# Patient Record
Sex: Female | Born: 1935 | Race: White | Hispanic: No
Health system: Southern US, Community
[De-identification: ages and names within clinical notes are randomized; demographics above are authoritative.]

## PROBLEM LIST (undated history)

## (undated) DIAGNOSIS — F0390 Unspecified dementia without behavioral disturbance: Secondary | ICD-10-CM

## (undated) DIAGNOSIS — I209 Angina pectoris, unspecified: Secondary | ICD-10-CM

## (undated) DIAGNOSIS — I10 Essential (primary) hypertension: Secondary | ICD-10-CM

## (undated) DIAGNOSIS — R296 Repeated falls: Secondary | ICD-10-CM

## (undated) DIAGNOSIS — E119 Type 2 diabetes mellitus without complications: Secondary | ICD-10-CM

## (undated) DIAGNOSIS — E785 Hyperlipidemia, unspecified: Secondary | ICD-10-CM

## (undated) DIAGNOSIS — M810 Age-related osteoporosis without current pathological fracture: Secondary | ICD-10-CM

---

## 2019-08-16 ENCOUNTER — Emergency Department (HOSPITAL_COMMUNITY): Payer: Medicare PPO

## 2019-08-16 ENCOUNTER — Other Ambulatory Visit: Payer: Self-pay

## 2019-08-16 ENCOUNTER — Encounter (HOSPITAL_COMMUNITY): Payer: Self-pay

## 2019-08-16 ENCOUNTER — Emergency Department (HOSPITAL_COMMUNITY)
Admission: EM | Admit: 2019-08-16 | Discharge: 2019-08-16 | Disposition: A | Discharge status: Home / Self Care | Payer: Medicare PPO | Attending: Emergency Medicine | Admitting: Emergency Medicine

## 2019-08-16 DIAGNOSIS — W01198A Fall on same level from slipping, tripping and stumbling with subsequent striking against other object, initial encounter: Secondary | ICD-10-CM | POA: Diagnosis not present

## 2019-08-16 DIAGNOSIS — S0240DA Maxillary fracture, left side, initial encounter for closed fracture: Secondary | ICD-10-CM | POA: Diagnosis not present

## 2019-08-16 DIAGNOSIS — Z66 Do not resuscitate: Secondary | ICD-10-CM | POA: Diagnosis not present

## 2019-08-16 DIAGNOSIS — Y92009 Unspecified place in unspecified non-institutional (private) residence as the place of occurrence of the external cause: Secondary | ICD-10-CM | POA: Diagnosis not present

## 2019-08-16 DIAGNOSIS — Z23 Encounter for immunization: Secondary | ICD-10-CM | POA: Diagnosis not present

## 2019-08-16 DIAGNOSIS — S0993XA Unspecified injury of face, initial encounter: Secondary | ICD-10-CM | POA: Diagnosis present

## 2019-08-16 DIAGNOSIS — S3991XA Unspecified injury of abdomen, initial encounter: Secondary | ICD-10-CM | POA: Insufficient documentation

## 2019-08-16 DIAGNOSIS — S02842A Fracture of lateral orbital wall, left side, initial encounter for closed fracture: Secondary | ICD-10-CM | POA: Diagnosis not present

## 2019-08-16 DIAGNOSIS — Y939 Activity, unspecified: Secondary | ICD-10-CM | POA: Diagnosis not present

## 2019-08-16 DIAGNOSIS — I10 Essential (primary) hypertension: Secondary | ICD-10-CM | POA: Diagnosis not present

## 2019-08-16 DIAGNOSIS — E119 Type 2 diabetes mellitus without complications: Secondary | ICD-10-CM | POA: Insufficient documentation

## 2019-08-16 DIAGNOSIS — Y999 Unspecified external cause status: Secondary | ICD-10-CM | POA: Diagnosis not present

## 2019-08-16 DIAGNOSIS — S0240CA Maxillary fracture, right side, initial encounter for closed fracture: Secondary | ICD-10-CM | POA: Insufficient documentation

## 2019-08-16 DIAGNOSIS — W19XXXA Unspecified fall, initial encounter: Secondary | ICD-10-CM

## 2019-08-16 DIAGNOSIS — S0292XA Unspecified fracture of facial bones, initial encounter for closed fracture: Secondary | ICD-10-CM

## 2019-08-16 DIAGNOSIS — S299XXA Unspecified injury of thorax, initial encounter: Secondary | ICD-10-CM | POA: Diagnosis not present

## 2019-08-16 HISTORY — DX: Hyperlipidemia, unspecified: E78.5

## 2019-08-16 HISTORY — DX: Angina pectoris, unspecified: I20.9

## 2019-08-16 HISTORY — DX: Type 2 diabetes mellitus without complications: E11.9

## 2019-08-16 HISTORY — DX: Essential (primary) hypertension: I10

## 2019-08-16 HISTORY — DX: Age-related osteoporosis without current pathological fracture: M81.0

## 2019-08-16 LAB — CBC WITH DIFFERENTIAL/PLATELET
Abs Immature Granulocytes: 0.04 K/uL (ref 0.00–0.07)
Basophils Absolute: 0 K/uL (ref 0.0–0.1)
Basophils Relative: 1 %
Eosinophils Absolute: 0.1 K/uL (ref 0.0–0.5)
Eosinophils Relative: 1 %
HCT: 42.5 % (ref 36.0–46.0)
Hemoglobin: 13.6 g/dL (ref 12.0–15.0)
Immature Granulocytes: 1 %
Lymphocytes Relative: 10 %
Lymphs Abs: 0.6 K/uL — ABNORMAL LOW (ref 0.7–4.0)
MCH: 29.6 pg (ref 26.0–34.0)
MCHC: 32 g/dL (ref 30.0–36.0)
MCV: 92.4 fL (ref 80.0–100.0)
Monocytes Absolute: 0.5 K/uL (ref 0.1–1.0)
Monocytes Relative: 9 %
Neutro Abs: 4.7 K/uL (ref 1.7–7.7)
Neutrophils Relative %: 78 %
Platelets: 123 K/uL — ABNORMAL LOW (ref 150–400)
RBC: 4.6 MIL/uL (ref 3.87–5.11)
RDW: 13.1 % (ref 11.5–15.5)
WBC: 5.9 K/uL (ref 4.0–10.5)
nRBC: 0 % (ref 0.0–0.2)

## 2019-08-16 LAB — BASIC METABOLIC PANEL WITH GFR
Anion gap: 12 (ref 5–15)
BUN: 26 mg/dL — ABNORMAL HIGH (ref 8–23)
CO2: 23 mmol/L (ref 22–32)
Calcium: 9.5 mg/dL (ref 8.9–10.3)
Chloride: 103 mmol/L (ref 98–111)
Creatinine, Ser: 1.11 mg/dL — ABNORMAL HIGH (ref 0.44–1.00)
GFR calc Af Amer: 50 mL/min — ABNORMAL LOW
GFR calc non Af Amer: 43 mL/min — ABNORMAL LOW
Glucose, Bld: 234 mg/dL — ABNORMAL HIGH (ref 70–99)
Potassium: 4.4 mmol/L (ref 3.5–5.1)
Sodium: 138 mmol/L (ref 135–145)

## 2019-08-16 LAB — TROPONIN I (HIGH SENSITIVITY)
Troponin I (High Sensitivity): 4 ng/L
Troponin I (High Sensitivity): 6 ng/L (ref <18)

## 2019-08-16 MED ORDER — CEPHALEXIN 500 MG PO CAPS: 500.0000 mg | ORAL_CAPSULE | Freq: Two times a day (BID) | ORAL | 0 refills | Status: DC

## 2019-08-16 MED ORDER — ONDANSETRON HCL 4 MG/2ML IJ SOLN
4.0000 mg | Freq: Once | INTRAMUSCULAR | Status: AC
  Administered 2019-08-16: 4 mg via INTRAVENOUS
  Filled 2019-08-16: qty 2

## 2019-08-16 MED ORDER — LORAZEPAM 2 MG/ML IJ SOLN: 0.5000 mg | Freq: Once | INTRAMUSCULAR | Status: AC

## 2019-08-16 MED ORDER — IOHEXOL 300 MG/ML  SOLN
80.0000 mL | Freq: Once | INTRAMUSCULAR | Status: AC | PRN
  Administered 2019-08-16: 80 mL via INTRAVENOUS

## 2019-08-16 MED ORDER — TETANUS-DIPHTH-ACELL PERTUSSIS 5-2.5-18.5 LF-MCG/0.5 IM SUSP
0.5000 mL | Freq: Once | INTRAMUSCULAR | Status: AC
  Administered 2019-08-16: 0.5 mL via INTRAMUSCULAR
  Filled 2019-08-16: qty 0.5

## 2019-08-16 MED ORDER — LORAZEPAM 2 MG/ML IJ SOLN
INTRAMUSCULAR | Status: AC
  Administered 2019-08-16: 16:00:00 0.5 mg via INTRAMUSCULAR
  Filled 2019-08-16: qty 1

## 2019-08-16 NOTE — ED Triage Notes (Addendum)
Pt resident of [facility].  Staff says pt had been asleep in her chair and got up to walk out of room and fell into the door jam.  Pt has large amount of swelling to left side of face and c/o pain to left shoulder.  Reports nose was bleeding initially but controlled at this time.  Pt states her shoulder and face hurt but will not state her name.  Per ems, pt's cbg 296 and bp 204/96

## 2019-08-16 NOTE — Discharge Instructions (Addendum)
CT scan shows multiple fractures of the left eye socket and left sinus and cheek bone.  Take the antibiotics as prescribed and follow-up with Dr. Ross Marcus in 1 week to assess for healing.  Sleep with the head of bed elevated about 45 degrees and do not blow the nose. Chest x-ray shows a nodule in the lung which needs follow-up in 12 months to make sure it is not increasing. Return to the ED with new or worsening symptoms.

## 2019-08-16 NOTE — ED Provider Notes (Signed)
[facility] EMERGENCY DEPARTMENT Provider Note   CSN: [id number] Arrival date & time: 08/16/19  1434     History Chief Complaint  Patient presents with  . Fall    Christina Ball is a 86 y.o. female.  Level 5 caveat.  Patient unable to give a history.  Per EMS she was walking in her room and fell onto her door jam.  There is no loss of consciousness.  She sustained injury to her face as well as left shoulder.  There is a large amount of swelling to the left side of her face with some bleeding from her nose.  Patient complaining of pain in her face of her shoulder but unable to answer orientation questions.  She was found to be hypertensive per EMS.  No blood thinners on her medication list.  Complains of pain to her left face and left shoulder.  Discussed with patient's niece Christina Ball who is her power of attorney.  [phone].  She states patient does have dementia and normally cannot give a history or tell what is going on.  It is not unusual for her not to know the situation.  She does have a DNR in place.  She does not take any blood thinners. Christina Ball would not want her to be on life support or have resuscitation.  The history is provided by the patient and the EMS personnel. The history is limited by the condition of the patient.  Fall       Past Medical History:  Diagnosis Date  . Angina pectoris (HCC)   . Diabetes mellitus without complication (HCC)   . Hyperlipidemia   . Hypertension   . Osteoporosis     There are no problems to display for this patient.   History reviewed. No pertinent surgical history.   OB History   No obstetric history on file.     No family history on file.  Social History   Tobacco Use  . Smoking status: Unknown If Ever Smoked  Substance Use Topics  . Alcohol use: Not Currently    Comment: unknown  . Drug use: Not Currently    Comment: unknown    Home Medications Prior to Admission medications   Not on File    Allergies      Patient has no allergy information on record.  Review of Systems   Review of Systems  Unable to perform ROS: Patient nonverbal  Constitutional: Negative for activity change.    Physical Exam Updated Vital Signs BP (!) 177/100 (BP Location: Right Arm)   Pulse 89   Temp 98.3 F (36.8 C) (Oral)   Resp 20   SpO2 97%   Physical Exam Vitals and nursing note reviewed.  Constitutional:      General: She is not in acute distress.    Appearance: She is well-developed.  HENT:     Head: Normocephalic and atraumatic.     Comments: Large amount of swelling to left face and zygoma.  Periorbital ecchymosis on the left.  Patient unable to follow directions to test extraocular movements but is moving eyes across midline    Ears:     Comments: No septal hematoma or hemotympanum    Nose:     Comments: Dried blood in nares bilaterally, no septal hematoma    Mouth/Throat:     Pharynx: No oropharyngeal exudate.     Comments: Large amount of swelling to left cheek, dried blood in mouth, no active bleeding Eyes:     Conjunctiva/sclera:  Conjunctivae normal.     Pupils: Pupils are equal, round, and reactive to light.  Neck:     Comments: No C-spine tenderness Cardiovascular:     Rate and Rhythm: Normal rate and regular rhythm.     Heart sounds: Normal heart sounds. No murmur.  Pulmonary:     Effort: Pulmonary effort is normal. No respiratory distress.     Breath sounds: Normal breath sounds.  Abdominal:     Palpations: Abdomen is soft.     Tenderness: There is no abdominal tenderness. There is no guarding or rebound.  Musculoskeletal:        General: No tenderness. Normal range of motion.     Cervical back: Normal range of motion and neck supple.     Comments: No T or L-spine tenderness, full range of motion of hips bilaterally  Tenderness to palpation of left shoulder without significant deformity  Skin:    General: Skin is warm.  Neurological:     Mental Status: She is alert.      Motor: No abnormal muscle tone.     Comments: Moves all extremities, intermittently follows commands.  Mostly nonverbal. Unable to give a history.  Psychiatric:        Behavior: Behavior normal.     ED Results / Procedures / Treatments   Labs (all labs ordered are listed, but only abnormal results are displayed) Labs Reviewed  CBC WITH DIFFERENTIAL/PLATELET - Abnormal; Notable for the following components:      Result Value   Platelets 123 (*)    Lymphs Abs 0.6 (*)    All other components within normal limits  BASIC METABOLIC PANEL - Abnormal; Notable for the following components:   Glucose, Bld 234 (*)    BUN 26 (*)    Creatinine, Ser 1.11 (*)    GFR calc non Af Amer 43 (*)    GFR calc Af Amer 50 (*)    All other components within normal limits  TROPONIN I (HIGH SENSITIVITY)  TROPONIN I (HIGH SENSITIVITY)    EKG EKG Interpretation  Date/Time:  Sunday August 16 2019 14:40:01 EST Ventricular Rate:  83 PR Interval:    QRS Duration: 91 QT Interval:  370 QTC Calculation: 435 R Axis:   -7 Text Interpretation: Sinus rhythm Multiple premature complexes, vent & supraven Aberrant conduction of SV complex(es) Inferior infarct, old No previous ECGs available Confirmed by Ezequiel Essex [phone]) on 08/16/2019 3:51:22 PM   Radiology DG Chest 1 View  Result Date: 08/16/2019 CLINICAL DATA:  Pain EXAM: CHEST  1 VIEW COMPARISON:  None. FINDINGS: There is an airspace opacity overlying the left mid lung zone. The lung volumes are low. Kerley B lines are noted. The heart size is normal. There is a small left-sided pleural effusion. There is no pneumothorax. There are multiple age-indeterminate left-sided rib fractures, several which are suspicious for acute fractures. Aortic calcifications are noted. IMPRESSION: 1. Possible acute left-sided rib fractures. Correlation with physical exam is recommended. A dedicated left rib series may be useful for further evaluation. 2. Small left-sided  pleural effusion.  No pneumothorax. 3. Airspace opacity in the left mid lung zone of unknown clinical significance. This could represent an infiltrate or pulmonary contusion. A follow-up chest x-ray is recommended in 4-6 weeks to confirm resolution of this finding. 4. Low lung volumes.  Probable mild interstitial edema. Electronically Signed   By: Constance Holster M.D.   On: 08/16/2019 16:20   DG Pelvis 1-2 Views  Result Date: 08/16/2019 CLINICAL  DATA:  Pain status post fall EXAM: PELVIS - 1-2 VIEW COMPARISON:  None. FINDINGS: There is no evidence of pelvic fracture or diastasis. No pelvic bone lesions are seen. IMPRESSION: Negative. Electronically Signed   By: Katherine Mantle M.D.   On: 08/16/2019 16:21   CT Head Wo Contrast  Result Date: 08/16/2019 CLINICAL DATA:  Fall from standing height into door frame. EXAM: CT HEAD WITHOUT CONTRAST CT MAXILLOFACIAL WITHOUT CONTRAST CT CERVICAL SPINE WITHOUT CONTRAST TECHNIQUE: Multidetector CT imaging of the head, cervical spine, and maxillofacial structures were performed using the standard protocol without intravenous contrast. Multiplanar CT image reconstructions of the cervical spine and maxillofacial structures were also generated. COMPARISON:  None. FINDINGS: Moderate motion artifact throughout the exam as multiple images had to be repeated. CT HEAD FINDINGS Brain: Ventricles, cisterns and CSF spaces are mildly prominent compatible with age related atrophy. There is chronic ischemic microvascular disease. There is no mass, mass effect, shift of midline structures or acute hemorrhage. Evidence of acute infarction Vascular: No hyperdense vessel or unexpected calcification. Skull: No evidence of skull fracture. Displaced fractures of the anterolateral walls of the left maxillary sinus and left orbital floor. Other: Left periorbital soft tissue swelling. CT MAXILLOFACIAL FINDINGS Osseous: Examination demonstrates displaced comminuted fractures of the anterior  wall left maxillary sinus/left orbital floor. No entrapment of left intraorbital contents. There is minimally displaced comminuted fracture of the lateral wall of the left maxillary sinus. Subtle nondisplaced fracture involving the anterior wall of the right maxillary sinus. Subtle fracture along the posterior aspect of the lateral wall of the left orbit. Moderate degenerative changes of the temporomandibular joints bilaterally. Orbits: Globes are normal and symmetric. Retrobulbar spaces are normal. Moderate left periorbital soft tissue swelling. Depressed slightly comminuted fracture of the left orbital floor as described above. No entrapment of orbital contents. Subtle fracture of the posterior aspect of the lateral wall of the left orbit. Sinuses: Moderate opacification throughout the maxillary sinuses likely hemorrhagic debris. Mastoid air cells are clear. Deviation of the nasal septum to the right. Left maxillary sinus wall fractures as described. Soft tissues: Moderate soft tissue swelling over the left mid to lower face and periorbital region. CT CERVICAL SPINE FINDINGS Alignment: No posttraumatic subluxation. Skull base and vertebrae: Atlantoaxial articulation is unremarkable. There is uncovertebral joint spurring and facet arthropathy. No definite acute fracture. Mild to moderate spondylosis of the cervical spine. Vertebral body heights are maintained. Soft tissues and spinal canal: No prevertebral fluid or swelling. No visible canal hematoma. Disc levels: Mild disc space narrowing is present at the C6-7 level. Upper chest: No acute findings. Other: None. IMPRESSION: 1.  No acute brain injury. 2. Chronic ischemic microvascular disease and age related atrophic change. 3. Multiple acute facial bone fractures as described above involving the lateral wall and inferior floor of the left orbit, anterior and lateral walls of the left maxillary sinus and anterior wall of the right maxillary sinus. Associated soft  tissue swelling over the left face and periorbital region. Hemorrhagic debris within the maxillary sinuses. 4.  No acute cervical spine injury. 5. Mild to moderate spondylosis of the cervical spine with disc disease at the C6-7 level. Electronically Signed   By: Elberta Fortis M.D.   On: 08/16/2019 17:11   CT Chest W Contrast  Result Date: 08/16/2019 CLINICAL DATA:  Fall. Chest and abdominal trauma and pain. Initial encounter. EXAM: CT CHEST, ABDOMEN, AND PELVIS WITH CONTRAST TECHNIQUE: Multidetector CT imaging of the chest, abdomen and pelvis was performed following the  standard protocol during bolus administration of intravenous contrast. CONTRAST:  80mL OMNIPAQUE IOHEXOL 300 MG/ML  SOLN COMPARISON:  None. FINDINGS: CT CHEST FINDINGS Cardiovascular: No evidence of thoracic aortic injury or mediastinal hematoma. No pericardial effusion. Aortic and coronary artery atherosclerosis incidentally noted. Mediastinum/Nodes: No evidence of pneumomediastinum. No masses or pathologically enlarged lymph nodes identified. Lungs/Pleura: No evidence of pulmonary contusion. Mild atelectasis seen in the dependent portions of the lower lobes. No evidence of pneumothorax or hemothorax. A 4 mm pulmonary nodule is seen in the posterior left upper lobe on image 28/series 4. Musculoskeletal: No acute fractures or suspicious bone lesions identified. CT ABDOMEN PELVIS FINDINGS Hepatobiliary: No hepatic laceration or mass identified. Mild-to-moderate diffuse hepatic steatosis is seen. Gallstones are seen, however there is no evidence of cholecystitis or biliary dilatation. Pancreas: No parenchymal laceration, mass, or inflammatory changes identified. Spleen: No evidence of splenic laceration. Adrenal/Urinary Tract: No hemorrhage or parenchymal lacerations identified. Tiny cyst noted in upper pole of right kidney. No evidence of mass or hydronephrosis. Unremarkable unopacified urinary bladder. Stomach/Bowel: Unopacified bowel loops are  unremarkable in appearance. No evidence of hemoperitoneum. Diverticulosis is seen mainly involving the sigmoid colon, however there is no evidence of diverticulitis. Vascular/Lymphatic: No evidence of abdominal aortic injury or retroperitoneal hemorrhage. No pathologically enlarged lymph nodes identified. Aortic atherosclerosis incidentally noted. Reproductive:  No mass or other significant abnormality identified. Other:  None. Musculoskeletal: No acute fractures or suspicious bone lesions identified. IMPRESSION: 1. No evidence of traumatic injury or other acute findings within the chest, abdomen, or pelvis. 2. 4 mm indeterminate left upper lobe pulmonary nodule. No follow-up needed if patient is low-risk. Non-contrast chest CT can be considered in 12 months if patient is high-risk. This recommendation follows the consensus statement: Guidelines for Management of Incidental Pulmonary Nodules Detected on CT Images: From the Fleischner Society 2017; Radiology 2017; 284:228-243. 3. Hepatic steatosis and cholelithiasis. No radiographic evidence of cholecystitis. 4. Colonic diverticulosis, without radiographic evidence of diverticulitis. Electronically Signed   By: Danae OrleansJohn A Stahl M.D.   On: 08/16/2019 18:15   CT Cervical Spine Wo Contrast  Result Date: 08/16/2019 CLINICAL DATA:  Fall from standing height into door frame. EXAM: CT HEAD WITHOUT CONTRAST CT MAXILLOFACIAL WITHOUT CONTRAST CT CERVICAL SPINE WITHOUT CONTRAST TECHNIQUE: Multidetector CT imaging of the head, cervical spine, and maxillofacial structures were performed using the standard protocol without intravenous contrast. Multiplanar CT image reconstructions of the cervical spine and maxillofacial structures were also generated. COMPARISON:  None. FINDINGS: Moderate motion artifact throughout the exam as multiple images had to be repeated. CT HEAD FINDINGS Brain: Ventricles, cisterns and CSF spaces are mildly prominent compatible with age related atrophy.  There is chronic ischemic microvascular disease. There is no mass, mass effect, shift of midline structures or acute hemorrhage. Evidence of acute infarction Vascular: No hyperdense vessel or unexpected calcification. Skull: No evidence of skull fracture. Displaced fractures of the anterolateral walls of the left maxillary sinus and left orbital floor. Other: Left periorbital soft tissue swelling. CT MAXILLOFACIAL FINDINGS Osseous: Examination demonstrates displaced comminuted fractures of the anterior wall left maxillary sinus/left orbital floor. No entrapment of left intraorbital contents. There is minimally displaced comminuted fracture of the lateral wall of the left maxillary sinus. Subtle nondisplaced fracture involving the anterior wall of the right maxillary sinus. Subtle fracture along the posterior aspect of the lateral wall of the left orbit. Moderate degenerative changes of the temporomandibular joints bilaterally. Orbits: Globes are normal and symmetric. Retrobulbar spaces are normal. Moderate left periorbital soft tissue  swelling. Depressed slightly comminuted fracture of the left orbital floor as described above. No entrapment of orbital contents. Subtle fracture of the posterior aspect of the lateral wall of the left orbit. Sinuses: Moderate opacification throughout the maxillary sinuses likely hemorrhagic debris. Mastoid air cells are clear. Deviation of the nasal septum to the right. Left maxillary sinus wall fractures as described. Soft tissues: Moderate soft tissue swelling over the left mid to lower face and periorbital region. CT CERVICAL SPINE FINDINGS Alignment: No posttraumatic subluxation. Skull base and vertebrae: Atlantoaxial articulation is unremarkable. There is uncovertebral joint spurring and facet arthropathy. No definite acute fracture. Mild to moderate spondylosis of the cervical spine. Vertebral body heights are maintained. Soft tissues and spinal canal: No prevertebral fluid or  swelling. No visible canal hematoma. Disc levels: Mild disc space narrowing is present at the C6-7 level. Upper chest: No acute findings. Other: None. IMPRESSION: 1.  No acute brain injury. 2. Chronic ischemic microvascular disease and age related atrophic change. 3. Multiple acute facial bone fractures as described above involving the lateral wall and inferior floor of the left orbit, anterior and lateral walls of the left maxillary sinus and anterior wall of the right maxillary sinus. Associated soft tissue swelling over the left face and periorbital region. Hemorrhagic debris within the maxillary sinuses. 4.  No acute cervical spine injury. 5. Mild to moderate spondylosis of the cervical spine with disc disease at the C6-7 level. Electronically Signed   By: Elberta Fortisaniel  Boyle M.D.   On: 08/16/2019 17:11   CT ABDOMEN PELVIS W CONTRAST  Result Date: 08/16/2019 CLINICAL DATA:  Fall. Chest and abdominal trauma and pain. Initial encounter. EXAM: CT CHEST, ABDOMEN, AND PELVIS WITH CONTRAST TECHNIQUE: Multidetector CT imaging of the chest, abdomen and pelvis was performed following the standard protocol during bolus administration of intravenous contrast. CONTRAST:  80mL OMNIPAQUE IOHEXOL 300 MG/ML  SOLN COMPARISON:  None. FINDINGS: CT CHEST FINDINGS Cardiovascular: No evidence of thoracic aortic injury or mediastinal hematoma. No pericardial effusion. Aortic and coronary artery atherosclerosis incidentally noted. Mediastinum/Nodes: No evidence of pneumomediastinum. No masses or pathologically enlarged lymph nodes identified. Lungs/Pleura: No evidence of pulmonary contusion. Mild atelectasis seen in the dependent portions of the lower lobes. No evidence of pneumothorax or hemothorax. A 4 mm pulmonary nodule is seen in the posterior left upper lobe on image 28/series 4. Musculoskeletal: No acute fractures or suspicious bone lesions identified. CT ABDOMEN PELVIS FINDINGS Hepatobiliary: No hepatic laceration or mass  identified. Mild-to-moderate diffuse hepatic steatosis is seen. Gallstones are seen, however there is no evidence of cholecystitis or biliary dilatation. Pancreas: No parenchymal laceration, mass, or inflammatory changes identified. Spleen: No evidence of splenic laceration. Adrenal/Urinary Tract: No hemorrhage or parenchymal lacerations identified. Tiny cyst noted in upper pole of right kidney. No evidence of mass or hydronephrosis. Unremarkable unopacified urinary bladder. Stomach/Bowel: Unopacified bowel loops are unremarkable in appearance. No evidence of hemoperitoneum. Diverticulosis is seen mainly involving the sigmoid colon, however there is no evidence of diverticulitis. Vascular/Lymphatic: No evidence of abdominal aortic injury or retroperitoneal hemorrhage. No pathologically enlarged lymph nodes identified. Aortic atherosclerosis incidentally noted. Reproductive:  No mass or other significant abnormality identified. Other:  None. Musculoskeletal: No acute fractures or suspicious bone lesions identified. IMPRESSION: 1. No evidence of traumatic injury or other acute findings within the chest, abdomen, or pelvis. 2. 4 mm indeterminate left upper lobe pulmonary nodule. No follow-up needed if patient is low-risk. Non-contrast chest CT can be considered in 12 months if patient is high-risk. This  recommendation follows the consensus statement: Guidelines for Management of Incidental Pulmonary Nodules Detected on CT Images: From the Fleischner Society 2017; Radiology 2017; 284:228-243. 3. Hepatic steatosis and cholelithiasis. No radiographic evidence of cholecystitis. 4. Colonic diverticulosis, without radiographic evidence of diverticulitis. Electronically Signed   By: Danae Orleans M.D.   On: 08/16/2019 18:15   DG Shoulder Left  Result Date: 08/16/2019 CLINICAL DATA:  Pain EXAM: LEFT SHOULDER - 2+ VIEW COMPARISON:  None. FINDINGS: There is no acute displaced fracture. No dislocation. Moderate degenerative  changes are noted of the left glenohumeral joint. There are old healed left-sided rib fractures. IMPRESSION: Negative. Electronically Signed   By: Katherine Mantle M.D.   On: 08/16/2019 16:17   DG Humerus Left  Result Date: 08/16/2019 CLINICAL DATA:  Pain status post fall EXAM: LEFT HUMERUS - 2+ VIEW COMPARISON:  None. FINDINGS: There is no evidence of fracture or other focal bone lesions. Soft tissues are unremarkable. IMPRESSION: Negative. Electronically Signed   By: Katherine Mantle M.D.   On: 08/16/2019 16:22   CT Maxillofacial Wo Contrast  Result Date: 08/16/2019 CLINICAL DATA:  Fall from standing height into door frame. EXAM: CT HEAD WITHOUT CONTRAST CT MAXILLOFACIAL WITHOUT CONTRAST CT CERVICAL SPINE WITHOUT CONTRAST TECHNIQUE: Multidetector CT imaging of the head, cervical spine, and maxillofacial structures were performed using the standard protocol without intravenous contrast. Multiplanar CT image reconstructions of the cervical spine and maxillofacial structures were also generated. COMPARISON:  None. FINDINGS: Moderate motion artifact throughout the exam as multiple images had to be repeated. CT HEAD FINDINGS Brain: Ventricles, cisterns and CSF spaces are mildly prominent compatible with age related atrophy. There is chronic ischemic microvascular disease. There is no mass, mass effect, shift of midline structures or acute hemorrhage. Evidence of acute infarction Vascular: No hyperdense vessel or unexpected calcification. Skull: No evidence of skull fracture. Displaced fractures of the anterolateral walls of the left maxillary sinus and left orbital floor. Other: Left periorbital soft tissue swelling. CT MAXILLOFACIAL FINDINGS Osseous: Examination demonstrates displaced comminuted fractures of the anterior wall left maxillary sinus/left orbital floor. No entrapment of left intraorbital contents. There is minimally displaced comminuted fracture of the lateral wall of the left maxillary sinus.  Subtle nondisplaced fracture involving the anterior wall of the right maxillary sinus. Subtle fracture along the posterior aspect of the lateral wall of the left orbit. Moderate degenerative changes of the temporomandibular joints bilaterally. Orbits: Globes are normal and symmetric. Retrobulbar spaces are normal. Moderate left periorbital soft tissue swelling. Depressed slightly comminuted fracture of the left orbital floor as described above. No entrapment of orbital contents. Subtle fracture of the posterior aspect of the lateral wall of the left orbit. Sinuses: Moderate opacification throughout the maxillary sinuses likely hemorrhagic debris. Mastoid air cells are clear. Deviation of the nasal septum to the right. Left maxillary sinus wall fractures as described. Soft tissues: Moderate soft tissue swelling over the left mid to lower face and periorbital region. CT CERVICAL SPINE FINDINGS Alignment: No posttraumatic subluxation. Skull base and vertebrae: Atlantoaxial articulation is unremarkable. There is uncovertebral joint spurring and facet arthropathy. No definite acute fracture. Mild to moderate spondylosis of the cervical spine. Vertebral body heights are maintained. Soft tissues and spinal canal: No prevertebral fluid or swelling. No visible canal hematoma. Disc levels: Mild disc space narrowing is present at the C6-7 level. Upper chest: No acute findings. Other: None. IMPRESSION: 1.  No acute brain injury. 2. Chronic ischemic microvascular disease and age related atrophic change. 3. Multiple acute facial  bone fractures as described above involving the lateral wall and inferior floor of the left orbit, anterior and lateral walls of the left maxillary sinus and anterior wall of the right maxillary sinus. Associated soft tissue swelling over the left face and periorbital region. Hemorrhagic debris within the maxillary sinuses. 4.  No acute cervical spine injury. 5. Mild to moderate spondylosis of the  cervical spine with disc disease at the C6-7 level. Electronically Signed   By: Elberta Fortis M.D.   On: 08/16/2019 17:11    Procedures Procedures (including critical care time)  Medications Ordered in ED Medications  Tdap (BOOSTRIX) injection 0.5 mL (has no administration in time range)    ED Course  I have reviewed the triage vital signs and the nursing notes.  Pertinent labs & imaging results that were available during my care of the patient were reviewed by me and considered in my medical decision making (see chart for details).    MDM Rules/Calculators/A&P                     Patient with fall with facial injury as well as left shoulder pain.  Unable to give a history  X-ray is concerning for multiple left-sided rib fractures that appear to be subacute.  CT head is negative.  CT face shows fractures of the left orbit and left maxillary sinus as well as the right maxillary sinus.  Patient unable to cooperate and follow directions to test retro-ocular movements.  Facial fractures discussed with Dr. Ross Marcus of oral trauma.  Dr. Ross Marcus reviewed CT images.  He feels there is a low likelihood that she is clinically entrapped.  He states her fracture is minimally displaced.  He recommends sinus precautions, antibiotics and to be follow-up in his office. This will likely be nonoperative.  CT of chest shows no traumatic injury to chest, abdomen or pelvis.  There is a 1 nodule that will need follow-up in 1 year. Rib fractures not present.   Results d/w Christina Ball her POA and niece. Patient able to ambulate and tolerate PO.  followup with ENT next week. Ice. NSAIDs.  Return precautions discussed.  She appears stable for discharge back to her facility.  Final Clinical Impression(s) / ED Diagnoses Final diagnoses:  Fall, initial encounter  Closed extensive facial fractures, initial encounter [facility])    Rx / DC Orders ED Discharge Orders    None       Chandlar Staebell, Jeannett Senior,  MD 08/17/19 0107

## 2019-08-24 ENCOUNTER — Emergency Department (HOSPITAL_COMMUNITY): Payer: Medicare PPO

## 2019-08-24 ENCOUNTER — Other Ambulatory Visit: Payer: Self-pay

## 2019-08-24 ENCOUNTER — Encounter (HOSPITAL_COMMUNITY): Payer: Self-pay | Admitting: Emergency Medicine

## 2019-08-24 ENCOUNTER — Inpatient Hospital Stay (HOSPITAL_COMMUNITY)
Admission: EM | Admit: 2019-08-24 | Discharge: 2019-08-27 | DRG: 682 | Disposition: A | Discharge status: Home Health | Payer: Medicare PPO | Attending: Internal Medicine | Admitting: Internal Medicine

## 2019-08-24 DIAGNOSIS — W19XXXA Unspecified fall, initial encounter: Secondary | ICD-10-CM | POA: Diagnosis present

## 2019-08-24 DIAGNOSIS — R4182 Altered mental status, unspecified: Secondary | ICD-10-CM | POA: Diagnosis not present

## 2019-08-24 DIAGNOSIS — F05 Delirium due to known physiological condition: Secondary | ICD-10-CM | POA: Diagnosis present

## 2019-08-24 DIAGNOSIS — E119 Type 2 diabetes mellitus without complications: Secondary | ICD-10-CM | POA: Diagnosis not present

## 2019-08-24 DIAGNOSIS — Z79899 Other long term (current) drug therapy: Secondary | ICD-10-CM

## 2019-08-24 DIAGNOSIS — F0390 Unspecified dementia without behavioral disturbance: Secondary | ICD-10-CM | POA: Diagnosis not present

## 2019-08-24 DIAGNOSIS — E785 Hyperlipidemia, unspecified: Secondary | ICD-10-CM | POA: Diagnosis present

## 2019-08-24 DIAGNOSIS — E1122 Type 2 diabetes mellitus with diabetic chronic kidney disease: Secondary | ICD-10-CM | POA: Diagnosis present

## 2019-08-24 DIAGNOSIS — G9340 Encephalopathy, unspecified: Secondary | ICD-10-CM | POA: Diagnosis present

## 2019-08-24 DIAGNOSIS — N179 Acute kidney failure, unspecified: Secondary | ICD-10-CM | POA: Diagnosis not present

## 2019-08-24 DIAGNOSIS — R339 Retention of urine, unspecified: Secondary | ICD-10-CM | POA: Diagnosis present

## 2019-08-24 DIAGNOSIS — S0083XA Contusion of other part of head, initial encounter: Secondary | ICD-10-CM | POA: Diagnosis present

## 2019-08-24 DIAGNOSIS — G9341 Metabolic encephalopathy: Secondary | ICD-10-CM | POA: Diagnosis present

## 2019-08-24 DIAGNOSIS — N1832 Chronic kidney disease, stage 3b: Secondary | ICD-10-CM | POA: Diagnosis present

## 2019-08-24 DIAGNOSIS — Z66 Do not resuscitate: Secondary | ICD-10-CM | POA: Diagnosis present

## 2019-08-24 DIAGNOSIS — E860 Dehydration: Secondary | ICD-10-CM | POA: Diagnosis present

## 2019-08-24 DIAGNOSIS — Z7984 Long term (current) use of oral hypoglycemic drugs: Secondary | ICD-10-CM

## 2019-08-24 DIAGNOSIS — R338 Other retention of urine: Secondary | ICD-10-CM | POA: Diagnosis present

## 2019-08-24 DIAGNOSIS — Z20822 Contact with and (suspected) exposure to covid-19: Secondary | ICD-10-CM | POA: Diagnosis present

## 2019-08-24 DIAGNOSIS — I10 Essential (primary) hypertension: Secondary | ICD-10-CM | POA: Diagnosis present

## 2019-08-24 DIAGNOSIS — Y92129 Unspecified place in nursing home as the place of occurrence of the external cause: Secondary | ICD-10-CM

## 2019-08-24 DIAGNOSIS — M810 Age-related osteoporosis without current pathological fracture: Secondary | ICD-10-CM | POA: Diagnosis present

## 2019-08-24 DIAGNOSIS — I129 Hypertensive chronic kidney disease with stage 1 through stage 4 chronic kidney disease, or unspecified chronic kidney disease: Secondary | ICD-10-CM | POA: Diagnosis present

## 2019-08-24 LAB — CBC WITH DIFFERENTIAL/PLATELET
Abs Immature Granulocytes: 0.03 10*3/uL (ref 0.00–0.07)
Basophils Absolute: 0 10*3/uL (ref 0.0–0.1)
Basophils Relative: 0 %
Eosinophils Absolute: 0 10*3/uL (ref 0.0–0.5)
Eosinophils Relative: 0 %
HCT: 42.8 % (ref 36.0–46.0)
Hemoglobin: 13.6 g/dL (ref 12.0–15.0)
Immature Granulocytes: 0 %
Lymphocytes Relative: 3 %
Lymphs Abs: 0.2 10*3/uL — ABNORMAL LOW (ref 0.7–4.0)
MCH: 29.1 pg (ref 26.0–34.0)
MCHC: 31.8 g/dL (ref 30.0–36.0)
MCV: 91.6 fL (ref 80.0–100.0)
Monocytes Absolute: 0.2 10*3/uL (ref 0.1–1.0)
Monocytes Relative: 3 %
Neutro Abs: 6.5 10*3/uL (ref 1.7–7.7)
Neutrophils Relative %: 94 %
Platelets: 186 10*3/uL (ref 150–400)
RBC: 4.67 MIL/uL (ref 3.87–5.11)
RDW: 13.1 % (ref 11.5–15.5)
WBC: 7 10*3/uL (ref 4.0–10.5)
nRBC: 0 % (ref 0.0–0.2)

## 2019-08-24 LAB — COMPREHENSIVE METABOLIC PANEL
ALT: 42 U/L (ref 0–44)
AST: 46 U/L — ABNORMAL HIGH (ref 15–41)
Albumin: 4.3 g/dL (ref 3.5–5.0)
Alkaline Phosphatase: 62 U/L (ref 38–126)
Anion gap: 16 — ABNORMAL HIGH (ref 5–15)
BUN: 56 mg/dL — ABNORMAL HIGH (ref 8–23)
CO2: 18 mmol/L — ABNORMAL LOW (ref 22–32)
Calcium: 9.6 mg/dL (ref 8.9–10.3)
Chloride: 103 mmol/L (ref 98–111)
Creatinine, Ser: 1.48 mg/dL — ABNORMAL HIGH (ref 0.44–1.00)
GFR calc Af Amer: 35 mL/min — ABNORMAL LOW (ref >60)
GFR calc non Af Amer: 30 mL/min — ABNORMAL LOW (ref >60)
Glucose, Bld: 336 mg/dL — ABNORMAL HIGH (ref 70–99)
Potassium: 4.6 mmol/L (ref 3.5–5.1)
Sodium: 137 mmol/L (ref 135–145)
Total Bilirubin: 0.9 mg/dL (ref 0.3–1.2)
Total Protein: 7.8 g/dL (ref 6.5–8.1)

## 2019-08-24 LAB — URINALYSIS, ROUTINE W REFLEX MICROSCOPIC
Bilirubin Urine: NEGATIVE
Glucose, UA: 150 mg/dL — AB
Hgb urine dipstick: NEGATIVE
Ketones, ur: 5 mg/dL — AB
Leukocytes,Ua: NEGATIVE
Nitrite: NEGATIVE
Protein, ur: NEGATIVE mg/dL
Specific Gravity, Urine: 1.019 (ref 1.005–1.030)
pH: 5 (ref 5.0–8.0)

## 2019-08-24 LAB — BLOOD GAS, VENOUS
Acid-base deficit: 5.7 mmol/L — ABNORMAL HIGH (ref 0.0–2.0)
Bicarbonate: 19.7 mmol/L — ABNORMAL LOW (ref 20.0–28.0)
FIO2: 21
O2 Saturation: 82.5 %
Patient temperature: 36.3
pCO2, Ven: 34.3 mmHg — ABNORMAL LOW (ref 44.0–60.0)
pH, Ven: 7.358 (ref 7.250–7.430)
pO2, Ven: 50.8 mmHg — ABNORMAL HIGH (ref 32.0–45.0)

## 2019-08-24 LAB — RAPID URINE DRUG SCREEN, HOSP PERFORMED
Amphetamines: NOT DETECTED
Barbiturates: NOT DETECTED
Benzodiazepines: NOT DETECTED
Cocaine: NOT DETECTED
Opiates: NOT DETECTED
Tetrahydrocannabinol: NOT DETECTED

## 2019-08-24 LAB — CBG MONITORING, ED: Glucose-Capillary: 288 mg/dL — ABNORMAL HIGH (ref 70–99)

## 2019-08-24 LAB — AMMONIA: Ammonia: 17 umol/L (ref 9–35)

## 2019-08-24 LAB — RESPIRATORY PANEL BY RT PCR (FLU A&B, COVID)
Influenza A by PCR: NEGATIVE
Influenza B by PCR: NEGATIVE
SARS Coronavirus 2 by RT PCR: NEGATIVE

## 2019-08-24 LAB — LIPASE, BLOOD: Lipase: 33 U/L (ref 11–51)

## 2019-08-24 LAB — TSH: TSH: 1.245 u[IU]/mL (ref 0.350–4.500)

## 2019-08-24 MED ORDER — SODIUM CHLORIDE 0.45 % IV SOLN: INTRAVENOUS | Status: DC

## 2019-08-24 MED ORDER — ONDANSETRON HCL 4 MG PO TABS: 4.0000 mg | ORAL_TABLET | Freq: Four times a day (QID) | ORAL | Status: DC | PRN

## 2019-08-24 MED ORDER — ONDANSETRON HCL 4 MG/2ML IJ SOLN: 4.0000 mg | Freq: Four times a day (QID) | INTRAMUSCULAR | Status: DC | PRN

## 2019-08-24 MED ORDER — HEPARIN SODIUM (PORCINE) 5000 UNIT/ML IJ SOLN
5000.0000 [IU] | Freq: Three times a day (TID) | INTRAMUSCULAR | Status: DC
  Administered 2019-08-24 – 2019-08-27 (×9): 5000 [IU] via SUBCUTANEOUS
  Filled 2019-08-24 (×8): qty 1

## 2019-08-24 MED ORDER — ACETAMINOPHEN 325 MG PO TABS: 650.0000 mg | ORAL_TABLET | Freq: Four times a day (QID) | ORAL | Status: DC | PRN

## 2019-08-24 MED ORDER — ACETAMINOPHEN 650 MG RE SUPP: 650.0000 mg | Freq: Four times a day (QID) | RECTAL | Status: DC | PRN

## 2019-08-24 NOTE — ED Notes (Signed)
Patient's Grand Niece called as family contact.  Christina Ball [phone]

## 2019-08-24 NOTE — ED Triage Notes (Signed)
Pt is resident at [facility] and normally gets up and walks. Pt has extensive bruising to face and neck from previous fall on 08/16/19. Is alert but does not answer or talk.

## 2019-08-24 NOTE — H&P (Signed)
TRH H&P   Patient Demographics:    Christina Ball, is a 86 y.o. female  MRN: [id number]   DOB - 1924-02-09  Admit Date - 08/24/2019  Outpatient Primary MD for the patient is Patient, No Pcp Per  Referring MD/NP/PA: Dr Manus Gunning  Patient coming from: [facility] memory unit.  Chief Complaint  Patient presents with  . Altered Mental Status      HPI:    Christina Ball  is a 86 y.o. female, medical history of angina pectoris, diabetes mellitus, hyperlipidemia, hypertension, osteoporosis, patient is a resident at [facility] unit, history was obtained from [place] niece(NOK), medical records and ED staff, patient with baseline dementia, recently moved from [place] to [place] due to memory unit requirement, patient with recent ED visit 1/31 secondary to fall, with significant head bruising, patient was brought from facility secondary to decreased responsiveness, it started yesterday evening, and progressed today, patient is not responding, vital signs are stable, so she was sent to ED for evaluation, usually with baseline dementia, ambulates with a walker, CT T head was negative on 1/31 upon her fall, no history of seizures, blood sugar was noted to be 300 by EMS, apparently patient was started on Mobic and baclofen for her injuries, but has not taking them for the last few days(last dose 2/5) -In ED patient was noted to be responsive, but eyes open, able to protect her airways, CT head with no acute findings, no significant lab abnormalities, ammonia within normal limit, but UA still pending.   Review of systems:    Patient unable to provide any review of system given her altered mentation and baseline dementia   With Past History of the following :    Past Medical History:  Diagnosis Date  . Angina pectoris (HCC)   . Diabetes mellitus without complication (HCC)    . Hyperlipidemia   . Hypertension   . Osteoporosis       History reviewed. No pertinent surgical history.    Social History:     Social History   Tobacco Use  . Smoking status: Unknown If Ever Smoked  Substance Use Topics  . Alcohol use: Not Currently    Comment: unknown     Lives -[facility] memory unit    Family History :    History reviewed. No pertinent family history.   Home Medications:   Prior to Admission medications   Medication Sig Start Date End Date Taking? Authorizing Provider  acetaminophen (TYLENOL) 500 MG tablet Take 500 mg by mouth every 6 (six) hours as needed for mild pain or moderate pain.    [provider]  cephALEXin (KEFLEX) 500 MG capsule Take 1 capsule (500 mg total) by mouth 2 (two) times daily. 08/16/19   Rancour, Jeannett Senior, MD  losartan (COZAAR) 25 MG tablet Take 12.5 mg by mouth daily.    [provider]  Melatonin 3  MG TABS Take 3 mg by mouth at bedtime.    [provider]  metFORMIN (GLUCOPHAGE) 500 MG tablet Take 500 mg by mouth daily.    [provider]  nystatin (MYCOSTATIN/NYSTOP) powder Apply 1 application topically 2 (two) times daily. Applied under breasts for rash    [provider]  sertraline (ZOLOFT) 50 MG tablet Take 75 mg by mouth daily.    [provider]     Allergies:    No Known Allergies   Physical Exam:   Vitals  Blood pressure (!) 164/86, pulse 88, temperature (!) 97.4 F (36.3 C), temperature source Oral, resp. rate 17, height 5' (1.524 m), weight 63.5 kg, SpO2 100 %.   1. General frail elderly female, laying in bed in no apparent distress  2.  She is awake, her eyes are open, but she unable to answer any commands or answer questions  3.  Unable to perform appropriate clinical exam given she does not follow commands, but appears to be moving grossly without significant deficits  4. Ears and Eyes appear Normal, Conjunctivae clear, PERRLA.  Dry oral  mucosa., significant facial brusing, please see piture   5. Supple Neck, No JVD, No cervical lymphadenopathy appriciated, No Carotid Bruits.  6. Symmetrical Chest wall movement, Good air movement bilaterally, CTAB.  7. RRR, No Gallops, Rubs or Murmurs, No Parasternal Heave.  8. Positive Bowel Sounds, Abdomen Soft, No tenderness, No organomegaly appriciated,No rebound -guarding or rigidity.  9.  No Cyanosis, Normal Skin Turgor..  10. No Palpable Lymph Nodes in Neck or Axillae      Data Review:    CBC Recent Labs  Lab 08/24/19 1117  WBC 7.0  HGB 13.6  HCT 42.8  PLT 186  MCV 91.6  MCH 29.1  MCHC 31.8  RDW 13.1  LYMPHSABS 0.2*  MONOABS 0.2  EOSABS 0.0  BASOSABS 0.0   ------------------------------------------------------------------------------------------------------------------  Chemistries  Recent Labs  Lab 08/24/19 1117  NA 137  K 4.6  CL 103  CO2 18*  GLUCOSE 336*  BUN 56*  CREATININE 1.48*  CALCIUM 9.6  AST 46*  ALT 42  ALKPHOS 62  BILITOT 0.9   ------------------------------------------------------------------------------------------------------------------ estimated creatinine clearance is 19.8 mL/min (A) (by C-G formula based on SCr of 1.48 mg/dL (H)). ------------------------------------------------------------------------------------------------------------------ Recent Labs    08/24/19 1117  TSH 1.245    Coagulation profile No results for input(s): INR, PROTIME in the last 168 hours. ------------------------------------------------------------------------------------------------------------------- No results for input(s): DDIMER in the last 72 hours. -------------------------------------------------------------------------------------------------------------------  Cardiac Enzymes No results for input(s): CKMB, TROPONINI, MYOGLOBIN in the last 168 hours.  Invalid input(s):  CK ------------------------------------------------------------------------------------------------------------------ No results found for: BNP   ---------------------------------------------------------------------------------------------------------------  Urinalysis No results found for: COLORURINE, APPEARANCEUR, [place], [place], GLUCOSEU, [place], BILIRUBINUR, KETONESUR, PROTEINUR, UROBILINOGEN, NITRITE, LEUKOCYTESUR  ----------------------------------------------------------------------------------------------------------------   Imaging Results:    DG Chest 1 View  Result Date: 08/24/2019 CLINICAL DATA:  History of previous fall with altered mental status EXAM: CHEST  1 VIEW COMPARISON:  08/15/2018 FINDINGS: Cardiac shadow is stable. No vascular congestion is seen. The lungs are well aerated bilaterally. Previously seen density in the left mid lung has resolved in the interval. No bony abnormality is seen. Old rib fractures are noted on the left. IMPRESSION: Interval resolution of previously seen left-sided lung density. No acute abnormality seen. Electronically Signed   By: Inez Catalina M.D.   On: 08/24/2019 12:09   CT Head Wo Contrast  Result Date: 08/24/2019 CLINICAL DATA:  Multiple trauma secondary to a  fall on 08/16/2019. Bruising to the face and neck. EXAM: CT HEAD WITHOUT CONTRAST CT MAXILLOFACIAL WITHOUT CONTRAST CT CERVICAL SPINE WITHOUT CONTRAST TECHNIQUE: Multidetector CT imaging of the head, cervical spine, and maxillofacial structures were performed using the standard protocol without intravenous contrast. Multiplanar CT image reconstructions of the cervical spine and maxillofacial structures were also generated. COMPARISON:  CT scans dated 08/16/2019 FINDINGS: CT HEAD FINDINGS Brain: No evidence of acute infarction, hemorrhage, hydrocephalus, extra-axial collection or mass lesion/mass effect. There is moderate cerebral cortical atrophy with asymmetric dilatation of the lateral  ventricles. The atrophy is most severe in the temporal lobes. No significant change since the prior study. Diffuse periventricular white matter lucency consistent with chronic small vessel ischemic disease. Vascular: No hyperdense vessel or unexpected calcification. Skull: The skull is intact. There are facial bone fractures described below. Other: None CT MAXILLOFACIAL FINDINGS Osseous: Again noted are fractures of the anterior and lateral walls of the left maxillary sinus and of the floor of the left orbit. The previously described fracture of the anterior wall of the right maxillary sinus is not apparent on the current study. No other acute abnormality. Severe arthritic changes of the left mandibular condyle, chronic. Orbits: No acute abnormality of the orbits. Again noted is the slightly depressed fracture of the floor of the left orbit without entrapment of the inferior rectus muscle. Sinuses: There is persistent opacification of the left maxillary sinus by hemorrhage. The previously noted hemorrhage into the right maxillary sinus has almost completely resolved. The ethmoid air cells and frontal sinus and mastoid air cells and middle ear cavities are clear. Soft tissues: Interval marked decrease in the soft tissue contusion of the left cheek seen on the prior study. No new soft tissue abnormalities. CT CERVICAL SPINE FINDINGS Alignment: Normal. Skull base and vertebrae: No acute fracture. No primary bone lesion or focal pathologic process. Soft tissues and spinal canal: No prevertebral fluid or swelling. No visible canal hematoma. Disc levels: There is no significant disc bulging or disc protrusion in the cervical spine. There is moderate right facet arthritis at C3-4 moderate left facet arthritis at C4-5. No significant foraminal stenosis. Upper chest: Negative. Other: None IMPRESSION: 1. No acute intracranial abnormality. Atrophy with chronic small vessel ischemic disease. 2. No significant abnormality of the  cervical spine. 3. Fractures of the anterior and lateral walls of the left maxillary sinus and floor of the left orbit, unchanged. 4. Decreased soft tissue swelling of the left cheek. Decreased hemorrhage into the right axillary sinus. Electronically Signed   By: Francene Boyers M.D.   On: 08/24/2019 12:51   CT Cervical Spine Wo Contrast  Result Date: 08/24/2019 CLINICAL DATA:  Multiple trauma secondary to a fall on 08/16/2019. Bruising to the face and neck. EXAM: CT HEAD WITHOUT CONTRAST CT MAXILLOFACIAL WITHOUT CONTRAST CT CERVICAL SPINE WITHOUT CONTRAST TECHNIQUE: Multidetector CT imaging of the head, cervical spine, and maxillofacial structures were performed using the standard protocol without intravenous contrast. Multiplanar CT image reconstructions of the cervical spine and maxillofacial structures were also generated. COMPARISON:  CT scans dated 08/16/2019 FINDINGS: CT HEAD FINDINGS Brain: No evidence of acute infarction, hemorrhage, hydrocephalus, extra-axial collection or mass lesion/mass effect. There is moderate cerebral cortical atrophy with asymmetric dilatation of the lateral ventricles. The atrophy is most severe in the temporal lobes. No significant change since the prior study. Diffuse periventricular white matter lucency consistent with chronic small vessel ischemic disease. Vascular: No hyperdense vessel or unexpected calcification. Skull: The skull is intact. There are  facial bone fractures described below. Other: None CT MAXILLOFACIAL FINDINGS Osseous: Again noted are fractures of the anterior and lateral walls of the left maxillary sinus and of the floor of the left orbit. The previously described fracture of the anterior wall of the right maxillary sinus is not apparent on the current study. No other acute abnormality. Severe arthritic changes of the left mandibular condyle, chronic. Orbits: No acute abnormality of the orbits. Again noted is the slightly depressed fracture of the floor of  the left orbit without entrapment of the inferior rectus muscle. Sinuses: There is persistent opacification of the left maxillary sinus by hemorrhage. The previously noted hemorrhage into the right maxillary sinus has almost completely resolved. The ethmoid air cells and frontal sinus and mastoid air cells and middle ear cavities are clear. Soft tissues: Interval marked decrease in the soft tissue contusion of the left cheek seen on the prior study. No new soft tissue abnormalities. CT CERVICAL SPINE FINDINGS Alignment: Normal. Skull base and vertebrae: No acute fracture. No primary bone lesion or focal pathologic process. Soft tissues and spinal canal: No prevertebral fluid or swelling. No visible canal hematoma. Disc levels: There is no significant disc bulging or disc protrusion in the cervical spine. There is moderate right facet arthritis at C3-4 moderate left facet arthritis at C4-5. No significant foraminal stenosis. Upper chest: Negative. Other: None IMPRESSION: 1. No acute intracranial abnormality. Atrophy with chronic small vessel ischemic disease. 2. No significant abnormality of the cervical spine. 3. Fractures of the anterior and lateral walls of the left maxillary sinus and floor of the left orbit, unchanged. 4. Decreased soft tissue swelling of the left cheek. Decreased hemorrhage into the right axillary sinus. Electronically Signed   By: Francene Boyers M.D.   On: 08/24/2019 12:51   CT Maxillofacial Wo Contrast  Result Date: 08/24/2019 CLINICAL DATA:  Multiple trauma secondary to a fall on 08/16/2019. Bruising to the face and neck. EXAM: CT HEAD WITHOUT CONTRAST CT MAXILLOFACIAL WITHOUT CONTRAST CT CERVICAL SPINE WITHOUT CONTRAST TECHNIQUE: Multidetector CT imaging of the head, cervical spine, and maxillofacial structures were performed using the standard protocol without intravenous contrast. Multiplanar CT image reconstructions of the cervical spine and maxillofacial structures were also  generated. COMPARISON:  CT scans dated 08/16/2019 FINDINGS: CT HEAD FINDINGS Brain: No evidence of acute infarction, hemorrhage, hydrocephalus, extra-axial collection or mass lesion/mass effect. There is moderate cerebral cortical atrophy with asymmetric dilatation of the lateral ventricles. The atrophy is most severe in the temporal lobes. No significant change since the prior study. Diffuse periventricular white matter lucency consistent with chronic small vessel ischemic disease. Vascular: No hyperdense vessel or unexpected calcification. Skull: The skull is intact. There are facial bone fractures described below. Other: None CT MAXILLOFACIAL FINDINGS Osseous: Again noted are fractures of the anterior and lateral walls of the left maxillary sinus and of the floor of the left orbit. The previously described fracture of the anterior wall of the right maxillary sinus is not apparent on the current study. No other acute abnormality. Severe arthritic changes of the left mandibular condyle, chronic. Orbits: No acute abnormality of the orbits. Again noted is the slightly depressed fracture of the floor of the left orbit without entrapment of the inferior rectus muscle. Sinuses: There is persistent opacification of the left maxillary sinus by hemorrhage. The previously noted hemorrhage into the right maxillary sinus has almost completely resolved. The ethmoid air cells and frontal sinus and mastoid air cells and middle ear cavities are clear. Soft  tissues: Interval marked decrease in the soft tissue contusion of the left cheek seen on the prior study. No new soft tissue abnormalities. CT CERVICAL SPINE FINDINGS Alignment: Normal. Skull base and vertebrae: No acute fracture. No primary bone lesion or focal pathologic process. Soft tissues and spinal canal: No prevertebral fluid or swelling. No visible canal hematoma. Disc levels: There is no significant disc bulging or disc protrusion in the cervical spine. There is  moderate right facet arthritis at C3-4 moderate left facet arthritis at C4-5. No significant foraminal stenosis. Upper chest: Negative. Other: None IMPRESSION: 1. No acute intracranial abnormality. Atrophy with chronic small vessel ischemic disease. 2. No significant abnormality of the cervical spine. 3. Fractures of the anterior and lateral walls of the left maxillary sinus and floor of the left orbit, unchanged. 4. Decreased soft tissue swelling of the left cheek. Decreased hemorrhage into the right axillary sinus. Electronically Signed   By: Francene Boyers M.D.   On: 08/24/2019 12:51     Assessment & Plan:    Active Problems:   Dementia without behavioral disturbance (HCC)   Type 2 diabetes mellitus without complication (HCC)   Hyperlipidemia   Essential hypertension   Encephalopathy acute   Acute encephalopathy -Patient with underlying dementia, but more active at baseline, she is currently wake, but noninteractive, so far no acute etiology, CT head with no acute findings, patient used to be on baclofen but stopped it more than 72 hours ago, likely contributing, but for now I will hold all her medications, keep her on gentle hydration with IV fluids, and reassess after hydration, if no improvement in 24 hours, then will proceed with MRI brain. - Keep as unsafe to swallow given her mentation - follow UA  Dementia -Continue with supportive care  Hypertension -Hold Cozaar for now  Hyperlipidemia -Not on any statins at home  Diabetes mellitus -Hold Metformin, will monitor CBGs closely   DVT Prophylaxis Heparin   AM Labs Ordered, also please review Full Orders  Family Communication: Admission, patients condition and plan of care including tests being ordered have been discussed with the patient great niece Lupita Leash who indicate understanding and agree with the plan and Code Status.  Code Status DNR  Likely DC to [facility] memory unit  Condition GUARDED    Consults called:   None  Admission status: Observation  Time spent in minutes : 50 minutes   Huey Bienenstock M.D on 08/24/2019 at 3:26 PM  Between 7am to 7pm - Pager - [phone]. After 7pm go to [email or web address] - password [facility]  [facility] - Office  [phone]

## 2019-08-24 NOTE — ED Provider Notes (Signed)
[facility]PART[facility] Provider Note   CSN: [id number] Arrival date & time: 08/24/19  1110     History Chief Complaint  Patient presents with  . Altered Mental Status    Christina Ball is a 86 y.o. female.  Level 5  Caveat for altered mental status.  Patient from living facility with decreased responsiveness.  Her last normal was last night.  She is alert with stable vital signs but is not responding.  She normally walks with a walker and does have a history of dementia.  She is not answering questions or following commands.  She did have a fall on January 31 and was found to have multiple facial fractures.  She had a negative head CT at that time.  No new trauma by report. No history of seizure.  No tongue biting or incontinence. Patient not able to give any history.  EMS reports stable vital signs with blood sugar of 300  Lupita LeashDonna states patient has been taking Mobic as well as baclofen for her injuries but has not had them for several days  The history is provided by the patient and the EMS personnel. The history is limited by the condition of the patient.  Altered Mental Status      Past Medical History:  Diagnosis Date  . Angina pectoris (HCC)   . Diabetes mellitus without complication (HCC)   . Hyperlipidemia   . Hypertension   . Osteoporosis     There are no problems to display for this patient.   History reviewed. No pertinent surgical history.   OB History   No obstetric history on file.     History reviewed. No pertinent family history.  Social History   Tobacco Use  . Smoking status: Unknown If Ever Smoked  Substance Use Topics  . Alcohol use: Not Currently    Comment: unknown  . Drug use: Not Currently    Comment: unknown    Home Medications Prior to Admission medications   Medication Sig Start Date End Date Taking? Authorizing Provider  acetaminophen (TYLENOL) 500 MG tablet Take 500 mg by mouth every 6 (six) hours as needed for mild  pain or moderate pain.    [provider]  cephALEXin (KEFLEX) 500 MG capsule Take 1 capsule (500 mg total) by mouth 2 (two) times daily. 08/16/19   Shaine Newmark, Jeannett SeniorStephen, MD  losartan (COZAAR) 25 MG tablet Take 12.5 mg by mouth daily.    [provider]  Melatonin 3 MG TABS Take 3 mg by mouth at bedtime.    [provider]  metFORMIN (GLUCOPHAGE) 500 MG tablet Take 500 mg by mouth daily.    [provider]  nystatin (MYCOSTATIN/NYSTOP) powder Apply 1 application topically 2 (two) times daily. Applied under breasts for rash    [provider]  sertraline (ZOLOFT) 50 MG tablet Take 75 mg by mouth daily.    [provider]    Allergies    Patient has no known allergies.  Review of Systems   Review of Systems  Unable to perform ROS: Mental status change    Physical Exam Updated Vital Signs BP (!) 164/86   Pulse 88   Temp (!) 97.4 F (36.3 C) (Oral)   Resp 17   Ht 5' (1.524 m)   Wt 63.5 kg   SpO2 100%   BMI 27.34 kg/m   Physical Exam Vitals and nursing note reviewed.  Constitutional:      General: She is in acute distress.  Appearance: She is well-developed.     Comments: Chronically ill-appearing, does not speak or follow commands.  She is breathing on her own.  Right-sided gaze that does not cross midline  HENT:     Head: Normocephalic.     Comments: Old appearing ecchymosis and bruising to left face and neck    Mouth/Throat:     Pharynx: No oropharyngeal exudate.  Eyes:     Conjunctiva/sclera: Conjunctivae normal.     Pupils: Pupils are equal, round, and reactive to light.  Neck:     Comments: No meningismus. Cardiovascular:     Rate and Rhythm: Normal rate and regular rhythm.     Heart sounds: Normal heart sounds. No murmur.  Pulmonary:     Effort: Pulmonary effort is normal. No respiratory distress.     Breath sounds: Normal breath sounds.     Comments: Bruising to left breast Chest:     Chest wall: Tenderness  present.  Abdominal:     Palpations: Abdomen is soft.     Tenderness: There is no abdominal tenderness. There is no guarding or rebound.  Musculoskeletal:        General: No tenderness. Normal range of motion.     Cervical back: Normal range of motion and neck supple.  Skin:    General: Skin is warm.  Neurological:     Cranial Nerves: No cranial nerve deficit.     Motor: No abnormal muscle tone.     Coordination: Coordination normal.     Comments: Obtunded, does not follow commands  Psychiatric:        Behavior: Behavior normal.     ED Results / Procedures / Treatments   Labs (all labs ordered are listed, but only abnormal results are displayed) Labs Reviewed  CBC WITH DIFFERENTIAL/PLATELET - Abnormal; Notable for the following components:      Result Value   Lymphs Abs 0.2 (*)    All other components within normal limits  COMPREHENSIVE METABOLIC PANEL - Abnormal; Notable for the following components:   CO2 18 (*)    Glucose, Bld 336 (*)    BUN 56 (*)    Creatinine, Ser 1.48 (*)    AST 46 (*)    GFR calc non Af Amer 30 (*)    GFR calc Af Amer 35 (*)    Anion gap 16 (*)    All other components within normal limits  BLOOD GAS, VENOUS - Abnormal; Notable for the following components:   pCO2, Ven 34.3 (*)    pO2, Ven 50.8 (*)    Bicarbonate 19.7 (*)    Acid-base deficit 5.7 (*)    All other components within normal limits  CBG MONITORING, ED - Abnormal; Notable for the following components:   Glucose-Capillary 288 (*)    All other components within normal limits  RESPIRATORY PANEL BY RT PCR (FLU A&B, COVID)  URINE CULTURE  LIPASE, BLOOD  AMMONIA  TSH  URINALYSIS, ROUTINE W REFLEX MICROSCOPIC    EKG EKG Interpretation  Date/Time:  Monday August 24 2019 11:15:53 EST Ventricular Rate:  89 PR Interval:    QRS Duration: 89 QT Interval:  381 QTC Calculation: 464 R Axis:   1 Text Interpretation: Sinus rhythm Borderline low voltage, extremity leads Baseline  wander in lead(s) II No significant change was found Confirmed by Glynn Octave [phone]) on 08/24/2019 11:34:12 AM   Radiology DG Chest 1 View  Result Date: 08/24/2019 CLINICAL DATA:  History of previous fall with altered mental status  EXAM: CHEST  1 VIEW COMPARISON:  08/15/2018 FINDINGS: Cardiac shadow is stable. No vascular congestion is seen. The lungs are well aerated bilaterally. Previously seen density in the left mid lung has resolved in the interval. No bony abnormality is seen. Old rib fractures are noted on the left. IMPRESSION: Interval resolution of previously seen left-sided lung density. No acute abnormality seen. Electronically Signed   By: Alcide Clever M.D.   On: 08/24/2019 12:09   CT Head Wo Contrast  Result Date: 08/24/2019 CLINICAL DATA:  Multiple trauma secondary to a fall on 08/16/2019. Bruising to the face and neck. EXAM: CT HEAD WITHOUT CONTRAST CT MAXILLOFACIAL WITHOUT CONTRAST CT CERVICAL SPINE WITHOUT CONTRAST TECHNIQUE: Multidetector CT imaging of the head, cervical spine, and maxillofacial structures were performed using the standard protocol without intravenous contrast. Multiplanar CT image reconstructions of the cervical spine and maxillofacial structures were also generated. COMPARISON:  CT scans dated 08/16/2019 FINDINGS: CT HEAD FINDINGS Brain: No evidence of acute infarction, hemorrhage, hydrocephalus, extra-axial collection or mass lesion/mass effect. There is moderate cerebral cortical atrophy with asymmetric dilatation of the lateral ventricles. The atrophy is most severe in the temporal lobes. No significant change since the prior study. Diffuse periventricular white matter lucency consistent with chronic small vessel ischemic disease. Vascular: No hyperdense vessel or unexpected calcification. Skull: The skull is intact. There are facial bone fractures described below. Other: None CT MAXILLOFACIAL FINDINGS Osseous: Again noted are fractures of the anterior and lateral  walls of the left maxillary sinus and of the floor of the left orbit. The previously described fracture of the anterior wall of the right maxillary sinus is not apparent on the current study. No other acute abnormality. Severe arthritic changes of the left mandibular condyle, chronic. Orbits: No acute abnormality of the orbits. Again noted is the slightly depressed fracture of the floor of the left orbit without entrapment of the inferior rectus muscle. Sinuses: There is persistent opacification of the left maxillary sinus by hemorrhage. The previously noted hemorrhage into the right maxillary sinus has almost completely resolved. The ethmoid air cells and frontal sinus and mastoid air cells and middle ear cavities are clear. Soft tissues: Interval marked decrease in the soft tissue contusion of the left cheek seen on the prior study. No new soft tissue abnormalities. CT CERVICAL SPINE FINDINGS Alignment: Normal. Skull base and vertebrae: No acute fracture. No primary bone lesion or focal pathologic process. Soft tissues and spinal canal: No prevertebral fluid or swelling. No visible canal hematoma. Disc levels: There is no significant disc bulging or disc protrusion in the cervical spine. There is moderate right facet arthritis at C3-4 moderate left facet arthritis at C4-5. No significant foraminal stenosis. Upper chest: Negative. Other: None IMPRESSION: 1. No acute intracranial abnormality. Atrophy with chronic small vessel ischemic disease. 2. No significant abnormality of the cervical spine. 3. Fractures of the anterior and lateral walls of the left maxillary sinus and floor of the left orbit, unchanged. 4. Decreased soft tissue swelling of the left cheek. Decreased hemorrhage into the right axillary sinus. Electronically Signed   By: Francene Boyers M.D.   On: 08/24/2019 12:51   CT Cervical Spine Wo Contrast  Result Date: 08/24/2019 CLINICAL DATA:  Multiple trauma secondary to a fall on 08/16/2019. Bruising  to the face and neck. EXAM: CT HEAD WITHOUT CONTRAST CT MAXILLOFACIAL WITHOUT CONTRAST CT CERVICAL SPINE WITHOUT CONTRAST TECHNIQUE: Multidetector CT imaging of the head, cervical spine, and maxillofacial structures were performed using the standard protocol without  intravenous contrast. Multiplanar CT image reconstructions of the cervical spine and maxillofacial structures were also generated. COMPARISON:  CT scans dated 08/16/2019 FINDINGS: CT HEAD FINDINGS Brain: No evidence of acute infarction, hemorrhage, hydrocephalus, extra-axial collection or mass lesion/mass effect. There is moderate cerebral cortical atrophy with asymmetric dilatation of the lateral ventricles. The atrophy is most severe in the temporal lobes. No significant change since the prior study. Diffuse periventricular white matter lucency consistent with chronic small vessel ischemic disease. Vascular: No hyperdense vessel or unexpected calcification. Skull: The skull is intact. There are facial bone fractures described below. Other: None CT MAXILLOFACIAL FINDINGS Osseous: Again noted are fractures of the anterior and lateral walls of the left maxillary sinus and of the floor of the left orbit. The previously described fracture of the anterior wall of the right maxillary sinus is not apparent on the current study. No other acute abnormality. Severe arthritic changes of the left mandibular condyle, chronic. Orbits: No acute abnormality of the orbits. Again noted is the slightly depressed fracture of the floor of the left orbit without entrapment of the inferior rectus muscle. Sinuses: There is persistent opacification of the left maxillary sinus by hemorrhage. The previously noted hemorrhage into the right maxillary sinus has almost completely resolved. The ethmoid air cells and frontal sinus and mastoid air cells and middle ear cavities are clear. Soft tissues: Interval marked decrease in the soft tissue contusion of the left cheek seen on the  prior study. No new soft tissue abnormalities. CT CERVICAL SPINE FINDINGS Alignment: Normal. Skull base and vertebrae: No acute fracture. No primary bone lesion or focal pathologic process. Soft tissues and spinal canal: No prevertebral fluid or swelling. No visible canal hematoma. Disc levels: There is no significant disc bulging or disc protrusion in the cervical spine. There is moderate right facet arthritis at C3-4 moderate left facet arthritis at C4-5. No significant foraminal stenosis. Upper chest: Negative. Other: None IMPRESSION: 1. No acute intracranial abnormality. Atrophy with chronic small vessel ischemic disease. 2. No significant abnormality of the cervical spine. 3. Fractures of the anterior and lateral walls of the left maxillary sinus and floor of the left orbit, unchanged. 4. Decreased soft tissue swelling of the left cheek. Decreased hemorrhage into the right axillary sinus. Electronically Signed   By: Francene Boyers M.D.   On: 08/24/2019 12:51   CT Maxillofacial Wo Contrast  Result Date: 08/24/2019 CLINICAL DATA:  Multiple trauma secondary to a fall on 08/16/2019. Bruising to the face and neck. EXAM: CT HEAD WITHOUT CONTRAST CT MAXILLOFACIAL WITHOUT CONTRAST CT CERVICAL SPINE WITHOUT CONTRAST TECHNIQUE: Multidetector CT imaging of the head, cervical spine, and maxillofacial structures were performed using the standard protocol without intravenous contrast. Multiplanar CT image reconstructions of the cervical spine and maxillofacial structures were also generated. COMPARISON:  CT scans dated 08/16/2019 FINDINGS: CT HEAD FINDINGS Brain: No evidence of acute infarction, hemorrhage, hydrocephalus, extra-axial collection or mass lesion/mass effect. There is moderate cerebral cortical atrophy with asymmetric dilatation of the lateral ventricles. The atrophy is most severe in the temporal lobes. No significant change since the prior study. Diffuse periventricular white matter lucency consistent with  chronic small vessel ischemic disease. Vascular: No hyperdense vessel or unexpected calcification. Skull: The skull is intact. There are facial bone fractures described below. Other: None CT MAXILLOFACIAL FINDINGS Osseous: Again noted are fractures of the anterior and lateral walls of the left maxillary sinus and of the floor of the left orbit. The previously described fracture of the anterior wall of  the right maxillary sinus is not apparent on the current study. No other acute abnormality. Severe arthritic changes of the left mandibular condyle, chronic. Orbits: No acute abnormality of the orbits. Again noted is the slightly depressed fracture of the floor of the left orbit without entrapment of the inferior rectus muscle. Sinuses: There is persistent opacification of the left maxillary sinus by hemorrhage. The previously noted hemorrhage into the right maxillary sinus has almost completely resolved. The ethmoid air cells and frontal sinus and mastoid air cells and middle ear cavities are clear. Soft tissues: Interval marked decrease in the soft tissue contusion of the left cheek seen on the prior study. No new soft tissue abnormalities. CT CERVICAL SPINE FINDINGS Alignment: Normal. Skull base and vertebrae: No acute fracture. No primary bone lesion or focal pathologic process. Soft tissues and spinal canal: No prevertebral fluid or swelling. No visible canal hematoma. Disc levels: There is no significant disc bulging or disc protrusion in the cervical spine. There is moderate right facet arthritis at C3-4 moderate left facet arthritis at C4-5. No significant foraminal stenosis. Upper chest: Negative. Other: None IMPRESSION: 1. No acute intracranial abnormality. Atrophy with chronic small vessel ischemic disease. 2. No significant abnormality of the cervical spine. 3. Fractures of the anterior and lateral walls of the left maxillary sinus and floor of the left orbit, unchanged. 4. Decreased soft tissue swelling of  the left cheek. Decreased hemorrhage into the right axillary sinus. Electronically Signed   By: Francene Boyers M.D.   On: 08/24/2019 12:51    Procedures .Critical Care Performed by: Glynn Octave, MD Authorized by: Glynn Octave, MD   Critical care provider statement:    Critical care time (minutes):  35   Critical care was time spent personally by me on the following activities:  Discussions with consultants, evaluation of patient's response to treatment, examination of patient, ordering and performing treatments and interventions, ordering and review of laboratory studies, ordering and review of radiographic studies, pulse oximetry, re-evaluation of patient's condition, obtaining history from patient or surrogate and review of old charts   (including critical care time)  Medications Ordered in ED Medications - No data to display  ED Course  I have reviewed the triage vital signs and the nursing notes.  Pertinent labs & imaging results that were available during my care of the patient were reviewed by me and considered in my medical decision making (see chart for details).    MDM Rules/Calculators/A&P                      Patient here with altered mental status, last seen normal last night.  Fall on January 31 with multiple facial fractures.  Patient was not more awake when I saw her last week but does have a history of dementia.  She will not follow commands and will not speak.  She is protecting her airway.  DNR is in place.  CBG was normal.  EKG shows sinus rhythm.  CT head shows no acute abnormality and does show stable sinus fractures as per last week.  Labs are reassuring.  Ammonia is negative, no CO2 retention.  Unsure etiology of her altered mental status.  Consider polypharmacy.  Patient's grand niece says she has been taking baclofen as well as Mobic at her facility. Remains stable but minimally responsive.  Observation admission d/w Dr. Randol Kern. Suspect  polypharmacy at this time.  Family updated.   Final Clinical Impression(s) / ED Diagnoses Final diagnoses:  Altered mental status, unspecified altered mental status type    Rx / DC Orders ED Discharge Orders    None       Darrel Gloss, Annie Main, MD 08/24/19 [phone]

## 2019-08-25 ENCOUNTER — Observation Stay (HOSPITAL_COMMUNITY): Payer: Medicare PPO

## 2019-08-25 ENCOUNTER — Encounter (HOSPITAL_COMMUNITY): Payer: Self-pay | Admitting: Internal Medicine

## 2019-08-25 DIAGNOSIS — I10 Essential (primary) hypertension: Secondary | ICD-10-CM

## 2019-08-25 DIAGNOSIS — G308 Other Alzheimer's disease: Secondary | ICD-10-CM | POA: Diagnosis not present

## 2019-08-25 DIAGNOSIS — W19XXXA Unspecified fall, initial encounter: Secondary | ICD-10-CM | POA: Diagnosis present

## 2019-08-25 DIAGNOSIS — I129 Hypertensive chronic kidney disease with stage 1 through stage 4 chronic kidney disease, or unspecified chronic kidney disease: Secondary | ICD-10-CM | POA: Diagnosis present

## 2019-08-25 DIAGNOSIS — M810 Age-related osteoporosis without current pathological fracture: Secondary | ICD-10-CM | POA: Diagnosis present

## 2019-08-25 DIAGNOSIS — S0083XA Contusion of other part of head, initial encounter: Secondary | ICD-10-CM | POA: Diagnosis present

## 2019-08-25 DIAGNOSIS — Z66 Do not resuscitate: Secondary | ICD-10-CM | POA: Diagnosis present

## 2019-08-25 DIAGNOSIS — Y92129 Unspecified place in nursing home as the place of occurrence of the external cause: Secondary | ICD-10-CM | POA: Diagnosis not present

## 2019-08-25 DIAGNOSIS — N1832 Chronic kidney disease, stage 3b: Secondary | ICD-10-CM | POA: Diagnosis present

## 2019-08-25 DIAGNOSIS — G9340 Encephalopathy, unspecified: Secondary | ICD-10-CM | POA: Diagnosis present

## 2019-08-25 DIAGNOSIS — E785 Hyperlipidemia, unspecified: Secondary | ICD-10-CM | POA: Diagnosis present

## 2019-08-25 DIAGNOSIS — E119 Type 2 diabetes mellitus without complications: Secondary | ICD-10-CM | POA: Diagnosis not present

## 2019-08-25 DIAGNOSIS — E860 Dehydration: Secondary | ICD-10-CM | POA: Diagnosis present

## 2019-08-25 DIAGNOSIS — F0390 Unspecified dementia without behavioral disturbance: Secondary | ICD-10-CM | POA: Diagnosis present

## 2019-08-25 DIAGNOSIS — R339 Retention of urine, unspecified: Secondary | ICD-10-CM | POA: Diagnosis present

## 2019-08-25 DIAGNOSIS — R338 Other retention of urine: Secondary | ICD-10-CM

## 2019-08-25 DIAGNOSIS — N179 Acute kidney failure, unspecified: Secondary | ICD-10-CM | POA: Diagnosis present

## 2019-08-25 DIAGNOSIS — F05 Delirium due to known physiological condition: Secondary | ICD-10-CM | POA: Diagnosis present

## 2019-08-25 DIAGNOSIS — E1122 Type 2 diabetes mellitus with diabetic chronic kidney disease: Secondary | ICD-10-CM | POA: Diagnosis present

## 2019-08-25 DIAGNOSIS — Z79899 Other long term (current) drug therapy: Secondary | ICD-10-CM | POA: Diagnosis not present

## 2019-08-25 DIAGNOSIS — Z20822 Contact with and (suspected) exposure to covid-19: Secondary | ICD-10-CM | POA: Diagnosis present

## 2019-08-25 DIAGNOSIS — Z7984 Long term (current) use of oral hypoglycemic drugs: Secondary | ICD-10-CM | POA: Diagnosis not present

## 2019-08-25 DIAGNOSIS — G9341 Metabolic encephalopathy: Secondary | ICD-10-CM | POA: Diagnosis present

## 2019-08-25 LAB — COMPREHENSIVE METABOLIC PANEL
ALT: 33 U/L (ref 0–44)
AST: 42 U/L — ABNORMAL HIGH (ref 15–41)
Albumin: 3.8 g/dL (ref 3.5–5.0)
Alkaline Phosphatase: 52 U/L (ref 38–126)
Anion gap: 9 (ref 5–15)
BUN: 56 mg/dL — ABNORMAL HIGH (ref 8–23)
CO2: 21 mmol/L — ABNORMAL LOW (ref 22–32)
Calcium: 9.2 mg/dL (ref 8.9–10.3)
Chloride: 110 mmol/L (ref 98–111)
Creatinine, Ser: 1.22 mg/dL — ABNORMAL HIGH (ref 0.44–1.00)
GFR calc Af Amer: 44 mL/min — ABNORMAL LOW (ref >60)
GFR calc non Af Amer: 38 mL/min — ABNORMAL LOW (ref >60)
Glucose, Bld: 177 mg/dL — ABNORMAL HIGH (ref 70–99)
Potassium: 4 mmol/L (ref 3.5–5.1)
Sodium: 140 mmol/L (ref 135–145)
Total Bilirubin: 1 mg/dL (ref 0.3–1.2)
Total Protein: 6.7 g/dL (ref 6.5–8.1)

## 2019-08-25 LAB — CBC
HCT: 37.5 % (ref 36.0–46.0)
Hemoglobin: 12 g/dL (ref 12.0–15.0)
MCH: 29.6 pg (ref 26.0–34.0)
MCHC: 32 g/dL (ref 30.0–36.0)
MCV: 92.6 fL (ref 80.0–100.0)
Platelets: 163 10*3/uL (ref 150–400)
RBC: 4.05 MIL/uL (ref 3.87–5.11)
RDW: 13.2 % (ref 11.5–15.5)
WBC: 6.3 10*3/uL (ref 4.0–10.5)
nRBC: 0 % (ref 0.0–0.2)

## 2019-08-25 LAB — GLUCOSE, CAPILLARY
Glucose-Capillary: 137 mg/dL — ABNORMAL HIGH (ref 70–99)
Glucose-Capillary: 141 mg/dL — ABNORMAL HIGH (ref 70–99)
Glucose-Capillary: 154 mg/dL — ABNORMAL HIGH (ref 70–99)
Glucose-Capillary: 161 mg/dL — ABNORMAL HIGH (ref 70–99)

## 2019-08-25 MED ORDER — LORAZEPAM 2 MG/ML IJ SOLN
0.5000 mg | Freq: Once | INTRAMUSCULAR | Status: AC | PRN
  Administered 2019-08-25: 0.5 mg via INTRAVENOUS
  Filled 2019-08-25: qty 1

## 2019-08-25 MED ORDER — CHLORHEXIDINE GLUCONATE CLOTH 2 % EX PADS
6.0000 | MEDICATED_PAD | Freq: Every day | CUTANEOUS | Status: DC
  Administered 2019-08-25 – 2019-08-27 (×3): 6 via TOPICAL

## 2019-08-25 NOTE — Progress Notes (Signed)
Pt has been sleeping since foley catheter inserted. Was awakened for trip to MRI, pt became combative. IV Ativan given per order for MRI sedation. Pt then down to MRI scanner via stretcher and back to room without incident. Pt again sleeping at present.

## 2019-08-25 NOTE — Progress Notes (Signed)
PROGRESS NOTE Christina Ball   Christina Ball  OVF:[id number]  DOB: 1923/08/11  DOA: 08/24/2019 PCP: Patient, No Pcp Per   Brief Admission Hx: 86 y.o. female, medical history of angina pectoris, diabetes mellitus, hyperlipidemia, hypertension, osteoporosis, patient is a resident at [facility] unit, history was obtained from Christina Ball niece(NOK), medical records and ED staff, patient with baseline dementia, recently moved from [place] to [place] due to memory unit requirement, patient with recent ED visit 1/31 secondary to fall, with significant head bruising, patient was brought from facility secondary to decreased responsiveness, it started yesterday evening, and progressed today, patient is not responding, vital signs are stable, so she was sent to ED for evaluation, usually with baseline dementia, ambulates with a walker, CT T head was negative on 1/31 upon her fall, no history of seizures, blood sugar was noted to be 300 by EMS, apparently patient was started on Mobic and baclofen for her injuries, but has not taking them for the last few days(last dose 2/5)  MDM/Assessment & Plan:   1. Acute metabolic encephalopathy - After learning about urinary retention, I am concerned that this is primary cause of her acute mental status changes.  We have done multiple I/O caths but unfortunately she continues to have significant urinary retention and we have placed foley cath.  MRI brain done but no acute findings were found.   2. Dementia - acute exacerbation of confusion noted above.  Delirium and safety precautions.  3. Essential hypertension - losartan initially held as part of stroke work up. Pt currently sedated (received lorazepam prior to MRI). Hopefully can resume 2/10.   4. Diabetes mellitus, type 2 - holding home metformin while in hospital. Diet control for now.   DVT prophylaxis: subcutaneous heparin Code Status: DNR  Family Communication: unable to reach legal  guardian, try again 2/10 Disposition Plan: from [facility] memory unit, after encephalopathy work up anticipate patient can safely return likely in next 24-48 hours. Pt may need to discharge with foley.    Consultants:    Procedures:    Antimicrobials:     Subjective: Pt remains very confused with dementia   Objective: Vitals:   08/24/19 1600 08/24/19 1715 08/24/19 2102 08/25/19 0545  BP: (!) 152/73 135/62 (!) 120/104 (!) 105/55  Pulse: 84 81 83 84  Resp: (!) 21 20 19 18   Temp:  98.5 F (36.9 C) 98.3 F (36.8 C) 98.2 F (36.8 C)  TempSrc:  Oral Oral Oral  SpO2: 99% 97% 99% 95%  Weight:  63.2 kg    Height:  5\' 5"  (1.651 m)      Intake/Output Summary (Last 24 hours) at 08/25/2019 1815 Last data filed at 08/25/2019 1300 Gross per 24 hour  Intake 500 ml  Output --  Net 500 ml   Filed Weights   08/24/19 1113 08/24/19 1715  Weight: 63.5 kg 63.2 kg   REVIEW OF SYSTEMS  UTO due to dementia  Exam:  General exam: elderly female lying in bed, no apparent distress Respiratory system:  No increased work of breathing. Cardiovascular system: S1 & S2 heard. No JVD, murmurs, gallops, clicks or pedal edema. Gastrointestinal system: Abdomen is nondistended, soft and nontender. Normal bowel sounds heard. Central nervous system: Alert and oriented. No focal neurological deficits. Extremities: no CCE.  Data Reviewed: Basic Metabolic Panel: Recent Labs  Lab 08/24/19 1117 08/25/19 0505  NA 137 140  K 4.6 4.0  CL 103 110  CO2 18* 21*  GLUCOSE 336* 177*  BUN 56* 56*  CREATININE 1.48* 1.22*  CALCIUM 9.6 9.2   Liver Function Tests: Recent Labs  Lab 08/24/19 1117 08/25/19 0505  AST 46* 42*  ALT 42 33  ALKPHOS 62 52  BILITOT 0.9 1.0  PROT 7.8 6.7  ALBUMIN 4.3 3.8   Recent Labs  Lab 08/24/19 1117  LIPASE 33   Recent Labs  Lab 08/24/19 1120  AMMONIA 17   CBC: Recent Labs  Lab 08/24/19 1117 08/25/19 0505  WBC 7.0 6.3  NEUTROABS 6.5  --   HGB 13.6 12.0   HCT 42.8 37.5  MCV 91.6 92.6  PLT 186 163   Cardiac Enzymes: No results for input(s): CKTOTAL, CKMB, CKMBINDEX, TROPONINI in the last 168 hours. CBG (last 3)  Recent Labs    08/25/19 0008 08/25/19 0548 08/25/19 1057  GLUCAP 161* 154* 141*   Recent Results (from the past 240 hour(s))  Respiratory Panel by RT PCR (Flu A&B, Covid) - Nasopharyngeal Swab     Status: None   Collection Time: 08/24/19 11:20 AM   Specimen: Nasopharyngeal Swab  Result Value Ref Range Status   SARS Coronavirus 2 by RT PCR NEGATIVE NEGATIVE Final    Comment: (NOTE) SARS-CoV-2 target nucleic acids are NOT DETECTED. The SARS-CoV-2 RNA is generally detectable in upper respiratoy specimens during the acute phase of infection. The lowest concentration of SARS-CoV-2 viral copies this assay can detect is 131 copies/mL. A negative result does not preclude SARS-Cov-2 infection and should not be used as the sole basis for treatment or other patient management decisions. A negative result may occur with  improper specimen collection/handling, submission of specimen other than nasopharyngeal swab, presence of viral mutation(s) within the areas targeted by this assay, and inadequate number of viral copies (<131 copies/mL). A negative result must be combined with clinical observations, patient history, and epidemiological information. The expected result is Negative. Fact Sheet for Patients:  https://www.moore.com/ Fact Sheet for Healthcare Providers:  https://www.young.biz/ This test is not yet ap proved or cleared by the [place] FDA and  has been authorized for detection and/or diagnosis of SARS-CoV-2 by FDA under an Emergency Use Authorization (EUA). This EUA will remain  in effect (meaning this test can be used) for the duration of the COVID-19 declaration under Section 564(b)(1) of the Act, 21 U.S.C. section 360bbb-3(b)(1), unless the authorization is terminated  or revoked sooner.    Influenza A by PCR NEGATIVE NEGATIVE Final   Influenza B by PCR NEGATIVE NEGATIVE Final    Comment: (NOTE) The Xpert Xpress SARS-CoV-2/FLU/RSV assay is intended as an aid in  the diagnosis of influenza from Nasopharyngeal swab specimens and  should not be used as a sole basis for treatment. Nasal washings and  aspirates are unacceptable for Xpert Xpress SARS-CoV-2/FLU/RSV  testing. Fact Sheet for Patients: https://www.moore.com/ Fact Sheet for Healthcare Providers: https://www.young.biz/ This test is not yet approved or cleared by the [place] FDA and  has been authorized for detection and/or diagnosis of SARS-CoV-2 by  FDA under an Emergency Use Authorization (EUA). This EUA will remain  in effect (meaning this test can be used) for the duration of the  Covid-19 declaration under Section 564(b)(1) of the Act, 21  U.S.C. section 360bbb-3(b)(1), unless the authorization is  terminated or revoked. Performed at [facility], [address] [place], [place] [address]      Studies: DG Chest 1 View  Result Date: 08/24/2019 CLINICAL DATA:  History of previous fall with altered mental status EXAM: CHEST  1  VIEW COMPARISON:  08/15/2018 FINDINGS: Cardiac shadow is stable. No vascular congestion is seen. The lungs are well aerated bilaterally. Previously seen density in the left mid lung has resolved in the interval. No bony abnormality is seen. Old rib fractures are noted on the left. IMPRESSION: Interval resolution of previously seen left-sided lung density. No acute abnormality seen. Electronically Signed   By: Alcide Clever M.D.   On: 08/24/2019 12:09   CT Head Wo Contrast  Result Date: 08/24/2019 CLINICAL DATA:  Multiple trauma secondary to a fall on 08/16/2019. Bruising to the face and neck. EXAM: CT HEAD WITHOUT CONTRAST CT MAXILLOFACIAL WITHOUT CONTRAST CT CERVICAL SPINE WITHOUT CONTRAST TECHNIQUE: Multidetector CT imaging of  the head, cervical spine, and maxillofacial structures were performed using the standard protocol without intravenous contrast. Multiplanar CT image reconstructions of the cervical spine and maxillofacial structures were also generated. COMPARISON:  CT scans dated 08/16/2019 FINDINGS: CT HEAD FINDINGS Brain: No evidence of acute infarction, hemorrhage, hydrocephalus, extra-axial collection or mass lesion/mass effect. There is moderate cerebral cortical atrophy with asymmetric dilatation of the lateral ventricles. The atrophy is most severe in the temporal lobes. No significant change since the prior study. Diffuse periventricular white matter lucency consistent with chronic small vessel ischemic disease. Vascular: No hyperdense vessel or unexpected calcification. Skull: The skull is intact. There are facial bone fractures described below. Other: None CT MAXILLOFACIAL FINDINGS Osseous: Again noted are fractures of the anterior and lateral walls of the left maxillary sinus and of the floor of the left orbit. The previously described fracture of the anterior wall of the right maxillary sinus is not apparent on the current study. No other acute abnormality. Severe arthritic changes of the left mandibular condyle, chronic. Orbits: No acute abnormality of the orbits. Again noted is the slightly depressed fracture of the floor of the left orbit without entrapment of the inferior rectus muscle. Sinuses: There is persistent opacification of the left maxillary sinus by hemorrhage. The previously noted hemorrhage into the right maxillary sinus has almost completely resolved. The ethmoid air cells and frontal sinus and mastoid air cells and middle ear cavities are clear. Soft tissues: Interval marked decrease in the soft tissue contusion of the left cheek seen on the prior study. No new soft tissue abnormalities. CT CERVICAL SPINE FINDINGS Alignment: Normal. Skull base and vertebrae: No acute fracture. No primary bone lesion or  focal pathologic process. Soft tissues and spinal canal: No prevertebral fluid or swelling. No visible canal hematoma. Disc levels: There is no significant disc bulging or disc protrusion in the cervical spine. There is moderate right facet arthritis at C3-4 moderate left facet arthritis at C4-5. No significant foraminal stenosis. Upper chest: Negative. Other: None IMPRESSION: 1. No acute intracranial abnormality. Atrophy with chronic small vessel ischemic disease. 2. No significant abnormality of the cervical spine. 3. Fractures of the anterior and lateral walls of the left maxillary sinus and floor of the left orbit, unchanged. 4. Decreased soft tissue swelling of the left cheek. Decreased hemorrhage into the right axillary sinus. Electronically Signed   By: Francene Boyers M.D.   On: 08/24/2019 12:51   CT Cervical Spine Wo Contrast  Result Date: 08/24/2019 CLINICAL DATA:  Multiple trauma secondary to a fall on 08/16/2019. Bruising to the face and neck. EXAM: CT HEAD WITHOUT CONTRAST CT MAXILLOFACIAL WITHOUT CONTRAST CT CERVICAL SPINE WITHOUT CONTRAST TECHNIQUE: Multidetector CT imaging of the head, cervical spine, and maxillofacial structures were performed using the standard protocol without intravenous contrast. Multiplanar CT  image reconstructions of the cervical spine and maxillofacial structures were also generated. COMPARISON:  CT scans dated 08/16/2019 FINDINGS: CT HEAD FINDINGS Brain: No evidence of acute infarction, hemorrhage, hydrocephalus, extra-axial collection or mass lesion/mass effect. There is moderate cerebral cortical atrophy with asymmetric dilatation of the lateral ventricles. The atrophy is most severe in the temporal lobes. No significant change since the prior study. Diffuse periventricular white matter lucency consistent with chronic small vessel ischemic disease. Vascular: No hyperdense vessel or unexpected calcification. Skull: The skull is intact. There are facial bone fractures  described below. Other: None CT MAXILLOFACIAL FINDINGS Osseous: Again noted are fractures of the anterior and lateral walls of the left maxillary sinus and of the floor of the left orbit. The previously described fracture of the anterior wall of the right maxillary sinus is not apparent on the current study. No other acute abnormality. Severe arthritic changes of the left mandibular condyle, chronic. Orbits: No acute abnormality of the orbits. Again noted is the slightly depressed fracture of the floor of the left orbit without entrapment of the inferior rectus muscle. Sinuses: There is persistent opacification of the left maxillary sinus by hemorrhage. The previously noted hemorrhage into the right maxillary sinus has almost completely resolved. The ethmoid air cells and frontal sinus and mastoid air cells and middle ear cavities are clear. Soft tissues: Interval marked decrease in the soft tissue contusion of the left cheek seen on the prior study. No new soft tissue abnormalities. CT CERVICAL SPINE FINDINGS Alignment: Normal. Skull base and vertebrae: No acute fracture. No primary bone lesion or focal pathologic process. Soft tissues and spinal canal: No prevertebral fluid or swelling. No visible canal hematoma. Disc levels: There is no significant disc bulging or disc protrusion in the cervical spine. There is moderate right facet arthritis at C3-4 moderate left facet arthritis at C4-5. No significant foraminal stenosis. Upper chest: Negative. Other: None IMPRESSION: 1. No acute intracranial abnormality. Atrophy with chronic small vessel ischemic disease. 2. No significant abnormality of the cervical spine. 3. Fractures of the anterior and lateral walls of the left maxillary sinus and floor of the left orbit, unchanged. 4. Decreased soft tissue swelling of the left cheek. Decreased hemorrhage into the right axillary sinus. Electronically Signed   By: Francene Boyers M.D.   On: 08/24/2019 12:51   MR BRAIN WO  CONTRAST  Result Date: 08/25/2019 CLINICAL DATA:  Encephalopathy. Facial trauma secondary to recent fall. The examination had to be discontinued prior to completion due to extreme patient motion despite attempts at sedation. EXAM: MRI HEAD WITHOUT CONTRAST TECHNIQUE: Multiplanar, multiecho pulse sequences of the brain and surrounding structures were obtained without intravenous contrast. COMPARISON:  CT head and face without contrast 08/24/2019 08/16/2019 FINDINGS: Brain: Only sagittal T1 weighted images diffusion-weighted images are obtained. These are significantly degraded by patient motion. No acute infarct is evident. No definite mass lesion or hematoma is present. Moderate atrophy is noted. IMPRESSION: Severe motion degraded study without evidence for acute infarct, hemorrhage, or mass. Detailed evaluation for causes of encephalopathy is limited. Electronically Signed   By: Marin Roberts M.D.   On: 08/25/2019 13:35   CT Maxillofacial Wo Contrast  Result Date: 08/24/2019 CLINICAL DATA:  Multiple trauma secondary to a fall on 08/16/2019. Bruising to the face and neck. EXAM: CT HEAD WITHOUT CONTRAST CT MAXILLOFACIAL WITHOUT CONTRAST CT CERVICAL SPINE WITHOUT CONTRAST TECHNIQUE: Multidetector CT imaging of the head, cervical spine, and maxillofacial structures were performed using the standard protocol without intravenous contrast. Multiplanar  CT image reconstructions of the cervical spine and maxillofacial structures were also generated. COMPARISON:  CT scans dated 08/16/2019 FINDINGS: CT HEAD FINDINGS Brain: No evidence of acute infarction, hemorrhage, hydrocephalus, extra-axial collection or mass lesion/mass effect. There is moderate cerebral cortical atrophy with asymmetric dilatation of the lateral ventricles. The atrophy is most severe in the temporal lobes. No significant change since the prior study. Diffuse periventricular white matter lucency consistent with chronic small vessel ischemic  disease. Vascular: No hyperdense vessel or unexpected calcification. Skull: The skull is intact. There are facial bone fractures described below. Other: None CT MAXILLOFACIAL FINDINGS Osseous: Again noted are fractures of the anterior and lateral walls of the left maxillary sinus and of the floor of the left orbit. The previously described fracture of the anterior wall of the right maxillary sinus is not apparent on the current study. No other acute abnormality. Severe arthritic changes of the left mandibular condyle, chronic. Orbits: No acute abnormality of the orbits. Again noted is the slightly depressed fracture of the floor of the left orbit without entrapment of the inferior rectus muscle. Sinuses: There is persistent opacification of the left maxillary sinus by hemorrhage. The previously noted hemorrhage into the right maxillary sinus has almost completely resolved. The ethmoid air cells and frontal sinus and mastoid air cells and middle ear cavities are clear. Soft tissues: Interval marked decrease in the soft tissue contusion of the left cheek seen on the prior study. No new soft tissue abnormalities. CT CERVICAL SPINE FINDINGS Alignment: Normal. Skull base and vertebrae: No acute fracture. No primary bone lesion or focal pathologic process. Soft tissues and spinal canal: No prevertebral fluid or swelling. No visible canal hematoma. Disc levels: There is no significant disc bulging or disc protrusion in the cervical spine. There is moderate right facet arthritis at C3-4 moderate left facet arthritis at C4-5. No significant foraminal stenosis. Upper chest: Negative. Other: None IMPRESSION: 1. No acute intracranial abnormality. Atrophy with chronic small vessel ischemic disease. 2. No significant abnormality of the cervical spine. 3. Fractures of the anterior and lateral walls of the left maxillary sinus and floor of the left orbit, unchanged. 4. Decreased soft tissue swelling of the left cheek. Decreased  hemorrhage into the right axillary sinus. Electronically Signed   By: Francene Boyers M.D.   On: 08/24/2019 12:51   Scheduled Meds: . heparin  5,000 Units Subcutaneous Q8H   Continuous Infusions: . sodium chloride 50 mL/hr at 08/25/19 1645    Active Problems:   Dementia without behavioral disturbance (HCC)   Type 2 diabetes mellitus without complication (HCC)   Hyperlipidemia   Essential hypertension   Encephalopathy acute   Acute urinary retention  Time spent:   Standley Dakins, MD [facility] 08/25/2019, 6:15 PM    LOS: 0 days  How to contact the [facility] Attending or Consulting provider 7A - 7P or covering provider during after hours 7P -7A, for this patient?  1. Check the care team in [facility] and look for a) attending/consulting [facility] provider listed and b) the [facility] team listed 2. Log into [email or web address] and use [place]'s universal password to access. If you do not have the password, please contact the hospital operator. 3. Locate the [facility] provider you are looking for under [facility] and page to a number that you can be directly reached. 4. If you still have difficulty reaching the provider, please page the [facility] (Director on Call) for the Hospitalists listed on amion for assistance.

## 2019-08-25 NOTE — Progress Notes (Signed)
Pt with increasing restlessness and agitation, climbing over siderails and pulling at lines. Pt has not voided this shift. Abd noted to be distended and pt grimaces with palpation. Pt assisted up to [facility] with 2 assists, [facility] for extended time but no urination. PT back to bed, bladder scan performed with results of 550 ml, and . I&O cath inserted with assistance of 5 people, immediately drained >500 ml. MD notified and requested to leave catheter in, stated OK to leave as indwelling foley. Total output now at >739ml clear amber urine.

## 2019-08-26 DIAGNOSIS — F0280 Dementia in other diseases classified elsewhere without behavioral disturbance: Secondary | ICD-10-CM

## 2019-08-26 DIAGNOSIS — G308 Other Alzheimer's disease: Secondary | ICD-10-CM

## 2019-08-26 LAB — URINE CULTURE: Culture: <10000 — AB

## 2019-08-26 LAB — GLUCOSE, CAPILLARY
Glucose-Capillary: 104 mg/dL — ABNORMAL HIGH (ref 70–99)
Glucose-Capillary: 109 mg/dL — ABNORMAL HIGH (ref 70–99)
Glucose-Capillary: 120 mg/dL — ABNORMAL HIGH (ref 70–99)
Glucose-Capillary: 129 mg/dL — ABNORMAL HIGH (ref 70–99)
Glucose-Capillary: 131 mg/dL — ABNORMAL HIGH (ref 70–99)

## 2019-08-26 NOTE — Progress Notes (Signed)
PROGRESS NOTE  Christina Ball GNF:[id number] DOB: 03/18/1924 DOA: 08/24/2019 PCP: Patient, No Pcp Per  Brief History:  86 y.o.female,medical history of angina pectoris, diabetes mellitus, hyperlipidemia, hypertension, osteoporosis, patient is a resident at [facility] unit, history was obtained fromGreatniece(NOK), medical records and ED staff,patient with baseline dementia, recently moved from [facility] to [facility] due to memory unit requirement,patient with recent ED visit 1/31 secondary to fall, with significant head bruising,patient was brought from facility secondary to decreased responsiveness, it started yesterday evening, and progressed today, patient is not responding, vital signs are stable, so she was sent to ED for evaluation, usually with baseline dementia, ambulates with a walker, CT T head was negative on 1/31 upon her fall, no history of seizures, blood sugar was noted to be 300 by EMS,apparently patient was started on Mobic and baclofen for her injuries, but has not taking them for the last few days(last dose 2/5)  Assessment/Plan: 1. Acute metabolic encephalopathy -secondary to urinary retention and dehydration and possibly baclofen usage -MRI brain negative for acute findings -08/26/19 great niece at bedside states mental status back to baseline -ammonia 17 -UA--no pyuria 2. Dementia with behavioral disturbance -having sundowning episodes  3. Essential hypertension  -holding losartan -BP remains acceptable 4. Diabetes mellitus, type 2 -holding metformin -CBGs acceptable 5. CKD stage 3b -baseline creatinine 1.1-1.2 -am BMP      Disposition Plan:   [facility] vs SNF if mental status stable 2/11  Family Communication:   Great niece updated at bedside 2/10  Consultants:  none  Code Status:  DNR  DVT Prophylaxis:  Martin Heparin   Procedures: As Listed in Progress Note Above  Antibiotics: None    Total time spent 35  minutes.  Greater than 50% spent face to face counseling and coordinating care.    Subjective: Pt is pleasantly confused.  Denies f/c cp, sob.  Remainder ROS unobtainable  Objective: Vitals:   08/25/19 0545 08/25/19 2210 08/26/19 0603 08/26/19 1459  BP: (!) 105/55 122/70 (!) 134/59 (!) 148/57  Pulse: 84 83 87 82  Resp: 18 20 19 18   Temp: 98.2 F (36.8 C) 98.9 F (37.2 C) 98.7 F (37.1 C) 98 F (36.7 C)  TempSrc: Oral Oral Oral   SpO2: 95% 95% 94% 96%  Weight:      Height:        Intake/Output Summary (Last 24 hours) at 08/26/2019 1812 Last data filed at 08/26/2019 1700 Gross per 24 hour  Intake 560.05 ml  Output 550 ml  Net 10.05 ml   Weight change:  Exam:   General:  Pt is alert, follows commands appropriately, not in acute distress  HEENT: No icterus, No thrush, No neck mass, [place]/AT  Cardiovascular: RRR, S1/S2, no rubs, no gallops  Respiratory: bibasilar crackles. No wheeze  Abdomen: Soft/+BS, non tender, non distended, no guarding  Extremities: No edema, No lymphangitis, No petechiae, No rashes, no synovitis   Data Reviewed: I have personally reviewed following labs and imaging studies Basic Metabolic Panel: Recent Labs  Lab 08/24/19 1117 08/25/19 0505  NA 137 140  K 4.6 4.0  CL 103 110  CO2 18* 21*  GLUCOSE 336* 177*  BUN 56* 56*  CREATININE 1.48* 1.22*  CALCIUM 9.6 9.2   Liver Function Tests: Recent Labs  Lab 08/24/19 1117 08/25/19 0505  AST 46* 42*  ALT 42 33  ALKPHOS 62 52  BILITOT 0.9 1.0  PROT 7.8 6.7  ALBUMIN 4.3  3.8   Recent Labs  Lab 08/24/19 1117  LIPASE 33   Recent Labs  Lab 08/24/19 1120  AMMONIA 17   Coagulation Profile: No results for input(s): INR, PROTIME in the last 168 hours. CBC: Recent Labs  Lab 08/24/19 1117 08/25/19 0505  WBC 7.0 6.3  NEUTROABS 6.5  --   HGB 13.6 12.0  HCT 42.8 37.5  MCV 91.6 92.6  PLT 186 163   Cardiac Enzymes: No results for input(s): CKTOTAL, CKMB, CKMBINDEX, TROPONINI in the  last 168 hours. BNP: Invalid input(s): POCBNP CBG: Recent Labs  Lab 08/25/19 1856 08/26/19 0017 08/26/19 0641 08/26/19 1135 08/26/19 1708  GLUCAP 137* 131* 120* 129* 109*   HbA1C: No results for input(s): HGBA1C in the last 72 hours. Urine analysis:    Component Value Date/Time   COLORURINE YELLOW 08/24/2019 1118   APPEARANCEUR CLEAR 08/24/2019 1118   LABSPEC 1.019 08/24/2019 1118   PHURINE 5.0 08/24/2019 1118   GLUCOSEU 150 (A) 08/24/2019 1118   HGBUR NEGATIVE 08/24/2019 1118   BILIRUBINUR NEGATIVE 08/24/2019 1118   KETONESUR 5 (A) 08/24/2019 1118   PROTEINUR NEGATIVE 08/24/2019 1118   NITRITE NEGATIVE 08/24/2019 1118   LEUKOCYTESUR NEGATIVE 08/24/2019 1118   Sepsis Labs: @LABRCNTIP (procalcitonin:4,lacticidven:4) ) Recent Results (from the past 240 hour(s))  Urine Culture     Status: Abnormal   Collection Time: 08/24/19 11:18 AM   Specimen: Urine, Clean Catch  Result Value Ref Range Status   Specimen Description   Final    URINE, CLEAN CATCH Performed at [facility], [address], [place], [place] [address]    Special Requests   Final    NONE Performed at [facility], [address], [place], [place] [address]    Culture (A)  Final    <10,000 COLONIES/mL INSIGNIFICANT GROWTH Performed at [facility], [address] [place], [place] [address]    Report Status 08/26/2019 FINAL  Final  Respiratory Panel by RT PCR (Flu A&B, Covid) - Nasopharyngeal Swab     Status: None   Collection Time: 08/24/19 11:20 AM   Specimen: Nasopharyngeal Swab  Result Value Ref Range Status   SARS Coronavirus 2 by RT PCR NEGATIVE NEGATIVE Final    Comment: (NOTE) SARS-CoV-2 target nucleic acids are NOT DETECTED. The SARS-CoV-2 RNA is generally detectable in upper respiratoy specimens during the acute phase of infection. The lowest concentration of SARS-CoV-2 viral copies this assay can detect is 131 copies/mL. A negative result does not preclude SARS-Cov-2 infection and  should not be used as the sole basis for treatment or other patient management decisions. A negative result may occur with  improper specimen collection/handling, submission of specimen other than nasopharyngeal swab, presence of viral mutation(s) within the areas targeted by this assay, and inadequate number of viral copies (<131 copies/mL). A negative result must be combined with clinical observations, patient history, and epidemiological information. The expected result is Negative. Fact Sheet for Patients:  https://www.moore.com/ Fact Sheet for Healthcare Providers:  https://www.young.biz/ This test is not yet ap proved or cleared by the [place] FDA and  has been authorized for detection and/or diagnosis of SARS-CoV-2 by FDA under an Emergency Use Authorization (EUA). This EUA will remain  in effect (meaning this test can be used) for the duration of the COVID-19 declaration under Section 564(b)(1) of the Act, 21 U.S.C. section 360bbb-3(b)(1), unless the authorization is terminated or revoked sooner.    Influenza A by PCR NEGATIVE NEGATIVE Final   Influenza B by PCR NEGATIVE  NEGATIVE Final    Comment: (NOTE) The Xpert Xpress SARS-CoV-2/FLU/RSV assay is intended as an aid in  the diagnosis of influenza from Nasopharyngeal swab specimens and  should not be used as a sole basis for treatment. Nasal washings and  aspirates are unacceptable for Xpert Xpress SARS-CoV-2/FLU/RSV  testing. Fact Sheet for Patients: https://www.moore.com/ Fact Sheet for Healthcare Providers: https://www.young.biz/ This test is not yet approved or cleared by the [place] FDA and  has been authorized for detection and/or diagnosis of SARS-CoV-2 by  FDA under an Emergency Use Authorization (EUA). This EUA will remain  in effect (meaning this test can be used) for the duration of the  Covid-19 declaration under Section  564(b)(1) of the Act, 21  U.S.C. section 360bbb-3(b)(1), unless the authorization is  terminated or revoked. Performed at [facility], [address] [place], [place] [address]      Scheduled Meds: . Chlorhexidine Gluconate Cloth  6 each Topical Daily  . heparin  5,000 Units Subcutaneous Q8H   Continuous Infusions: . sodium chloride 50 mL/hr at 08/26/19 1308    Procedures/Studies: DG Chest 1 View  Result Date: 08/24/2019 CLINICAL DATA:  History of previous fall with altered mental status EXAM: CHEST  1 VIEW COMPARISON:  08/15/2018 FINDINGS: Cardiac shadow is stable. No vascular congestion is seen. The lungs are well aerated bilaterally. Previously seen density in the left mid lung has resolved in the interval. No bony abnormality is seen. Old rib fractures are noted on the left. IMPRESSION: Interval resolution of previously seen left-sided lung density. No acute abnormality seen. Electronically Signed   By: Alcide Clever M.D.   On: 08/24/2019 12:09   DG Chest 1 View  Result Date: 08/16/2019 CLINICAL DATA:  Pain EXAM: CHEST  1 VIEW COMPARISON:  None. FINDINGS: There is an airspace opacity overlying the left mid lung zone. The lung volumes are low. Kerley B lines are noted. The heart size is normal. There is a small left-sided pleural effusion. There is no pneumothorax. There are multiple age-indeterminate left-sided rib fractures, several which are suspicious for acute fractures. Aortic calcifications are noted. IMPRESSION: 1. Possible acute left-sided rib fractures. Correlation with physical exam is recommended. A dedicated left rib series may be useful for further evaluation. 2. Small left-sided pleural effusion.  No pneumothorax. 3. Airspace opacity in the left mid lung zone of unknown clinical significance. This could represent an infiltrate or pulmonary contusion. A follow-up chest x-ray is recommended in 4-6 weeks to confirm resolution of this finding. 4. Low lung volumes.  Probable mild  interstitial edema. Electronically Signed   By: Katherine Mantle M.D.   On: 08/16/2019 16:20   DG Pelvis 1-2 Views  Result Date: 08/16/2019 CLINICAL DATA:  Pain status post fall EXAM: PELVIS - 1-2 VIEW COMPARISON:  None. FINDINGS: There is no evidence of pelvic fracture or diastasis. No pelvic bone lesions are seen. IMPRESSION: Negative. Electronically Signed   By: Katherine Mantle M.D.   On: 08/16/2019 16:21   CT Head Wo Contrast  Result Date: 08/24/2019 CLINICAL DATA:  Multiple trauma secondary to a fall on 08/16/2019. Bruising to the face and neck. EXAM: CT HEAD WITHOUT CONTRAST CT MAXILLOFACIAL WITHOUT CONTRAST CT CERVICAL SPINE WITHOUT CONTRAST TECHNIQUE: Multidetector CT imaging of the head, cervical spine, and maxillofacial structures were performed using the standard protocol without intravenous contrast. Multiplanar CT image reconstructions of the cervical spine and maxillofacial structures were also generated. COMPARISON:  CT scans dated 08/16/2019 FINDINGS: CT HEAD FINDINGS Brain: No evidence  of acute infarction, hemorrhage, hydrocephalus, extra-axial collection or mass lesion/mass effect. There is moderate cerebral cortical atrophy with asymmetric dilatation of the lateral ventricles. The atrophy is most severe in the temporal lobes. No significant change since the prior study. Diffuse periventricular white matter lucency consistent with chronic small vessel ischemic disease. Vascular: No hyperdense vessel or unexpected calcification. Skull: The skull is intact. There are facial bone fractures described below. Other: None CT MAXILLOFACIAL FINDINGS Osseous: Again noted are fractures of the anterior and lateral walls of the left maxillary sinus and of the floor of the left orbit. The previously described fracture of the anterior wall of the right maxillary sinus is not apparent on the current study. No other acute abnormality. Severe arthritic changes of the left mandibular condyle, chronic.  Orbits: No acute abnormality of the orbits. Again noted is the slightly depressed fracture of the floor of the left orbit without entrapment of the inferior rectus muscle. Sinuses: There is persistent opacification of the left maxillary sinus by hemorrhage. The previously noted hemorrhage into the right maxillary sinus has almost completely resolved. The ethmoid air cells and frontal sinus and mastoid air cells and middle ear cavities are clear. Soft tissues: Interval marked decrease in the soft tissue contusion of the left cheek seen on the prior study. No new soft tissue abnormalities. CT CERVICAL SPINE FINDINGS Alignment: Normal. Skull base and vertebrae: No acute fracture. No primary bone lesion or focal pathologic process. Soft tissues and spinal canal: No prevertebral fluid or swelling. No visible canal hematoma. Disc levels: There is no significant disc bulging or disc protrusion in the cervical spine. There is moderate right facet arthritis at C3-4 moderate left facet arthritis at C4-5. No significant foraminal stenosis. Upper chest: Negative. Other: None IMPRESSION: 1. No acute intracranial abnormality. Atrophy with chronic small vessel ischemic disease. 2. No significant abnormality of the cervical spine. 3. Fractures of the anterior and lateral walls of the left maxillary sinus and floor of the left orbit, unchanged. 4. Decreased soft tissue swelling of the left cheek. Decreased hemorrhage into the right axillary sinus. Electronically Signed   By: Francene Boyers M.D.   On: 08/24/2019 12:51   CT Head Wo Contrast  Result Date: 08/16/2019 CLINICAL DATA:  Fall from standing height into door frame. EXAM: CT HEAD WITHOUT CONTRAST CT MAXILLOFACIAL WITHOUT CONTRAST CT CERVICAL SPINE WITHOUT CONTRAST TECHNIQUE: Multidetector CT imaging of the head, cervical spine, and maxillofacial structures were performed using the standard protocol without intravenous contrast. Multiplanar CT image reconstructions of the  cervical spine and maxillofacial structures were also generated. COMPARISON:  None. FINDINGS: Moderate motion artifact throughout the exam as multiple images had to be repeated. CT HEAD FINDINGS Brain: Ventricles, cisterns and CSF spaces are mildly prominent compatible with age related atrophy. There is chronic ischemic microvascular disease. There is no mass, mass effect, shift of midline structures or acute hemorrhage. Evidence of acute infarction Vascular: No hyperdense vessel or unexpected calcification. Skull: No evidence of skull fracture. Displaced fractures of the anterolateral walls of the left maxillary sinus and left orbital floor. Other: Left periorbital soft tissue swelling. CT MAXILLOFACIAL FINDINGS Osseous: Examination demonstrates displaced comminuted fractures of the anterior wall left maxillary sinus/left orbital floor. No entrapment of left intraorbital contents. There is minimally displaced comminuted fracture of the lateral wall of the left maxillary sinus. Subtle nondisplaced fracture involving the anterior wall of the right maxillary sinus. Subtle fracture along the posterior aspect of the lateral wall of the left orbit. Moderate degenerative  changes of the temporomandibular joints bilaterally. Orbits: Globes are normal and symmetric. Retrobulbar spaces are normal. Moderate left periorbital soft tissue swelling. Depressed slightly comminuted fracture of the left orbital floor as described above. No entrapment of orbital contents. Subtle fracture of the posterior aspect of the lateral wall of the left orbit. Sinuses: Moderate opacification throughout the maxillary sinuses likely hemorrhagic debris. Mastoid air cells are clear. Deviation of the nasal septum to the right. Left maxillary sinus wall fractures as described. Soft tissues: Moderate soft tissue swelling over the left mid to lower face and periorbital region. CT CERVICAL SPINE FINDINGS Alignment: No posttraumatic subluxation. Skull base  and vertebrae: Atlantoaxial articulation is unremarkable. There is uncovertebral joint spurring and facet arthropathy. No definite acute fracture. Mild to moderate spondylosis of the cervical spine. Vertebral body heights are maintained. Soft tissues and spinal canal: No prevertebral fluid or swelling. No visible canal hematoma. Disc levels: Mild disc space narrowing is present at the C6-7 level. Upper chest: No acute findings. Other: None. IMPRESSION: 1.  No acute brain injury. 2. Chronic ischemic microvascular disease and age related atrophic change. 3. Multiple acute facial bone fractures as described above involving the lateral wall and inferior floor of the left orbit, anterior and lateral walls of the left maxillary sinus and anterior wall of the right maxillary sinus. Associated soft tissue swelling over the left face and periorbital region. Hemorrhagic debris within the maxillary sinuses. 4.  No acute cervical spine injury. 5. Mild to moderate spondylosis of the cervical spine with disc disease at the C6-7 level. Electronically Signed   By: Elberta Fortis M.D.   On: 08/16/2019 17:11   CT Chest W Contrast  Result Date: 08/16/2019 CLINICAL DATA:  Fall. Chest and abdominal trauma and pain. Initial encounter. EXAM: CT CHEST, ABDOMEN, AND PELVIS WITH CONTRAST TECHNIQUE: Multidetector CT imaging of the chest, abdomen and pelvis was performed following the standard protocol during bolus administration of intravenous contrast. CONTRAST:  69mL OMNIPAQUE IOHEXOL 300 MG/ML  SOLN COMPARISON:  None. FINDINGS: CT CHEST FINDINGS Cardiovascular: No evidence of thoracic aortic injury or mediastinal hematoma. No pericardial effusion. Aortic and coronary artery atherosclerosis incidentally noted. Mediastinum/Nodes: No evidence of pneumomediastinum. No masses or pathologically enlarged lymph nodes identified. Lungs/Pleura: No evidence of pulmonary contusion. Mild atelectasis seen in the dependent portions of the lower lobes.  No evidence of pneumothorax or hemothorax. A 4 mm pulmonary nodule is seen in the posterior left upper lobe on image 28/series 4. Musculoskeletal: No acute fractures or suspicious bone lesions identified. CT ABDOMEN PELVIS FINDINGS Hepatobiliary: No hepatic laceration or mass identified. Mild-to-moderate diffuse hepatic steatosis is seen. Gallstones are seen, however there is no evidence of cholecystitis or biliary dilatation. Pancreas: No parenchymal laceration, mass, or inflammatory changes identified. Spleen: No evidence of splenic laceration. Adrenal/Urinary Tract: No hemorrhage or parenchymal lacerations identified. Tiny cyst noted in upper pole of right kidney. No evidence of mass or hydronephrosis. Unremarkable unopacified urinary bladder. Stomach/Bowel: Unopacified bowel loops are unremarkable in appearance. No evidence of hemoperitoneum. Diverticulosis is seen mainly involving the sigmoid colon, however there is no evidence of diverticulitis. Vascular/Lymphatic: No evidence of abdominal aortic injury or retroperitoneal hemorrhage. No pathologically enlarged lymph nodes identified. Aortic atherosclerosis incidentally noted. Reproductive:  No mass or other significant abnormality identified. Other:  None. Musculoskeletal: No acute fractures or suspicious bone lesions identified. IMPRESSION: 1. No evidence of traumatic injury or other acute findings within the chest, abdomen, or pelvis. 2. 4 mm indeterminate left upper lobe pulmonary nodule. No  follow-up needed if patient is low-risk. Non-contrast chest CT can be considered in 12 months if patient is high-risk. This recommendation follows the consensus statement: Guidelines for Management of Incidental Pulmonary Nodules Detected on CT Images: From the Fleischner Society 2017; Radiology 2017; 284:228-243. 3. Hepatic steatosis and cholelithiasis. No radiographic evidence of cholecystitis. 4. Colonic diverticulosis, without radiographic evidence of  diverticulitis. Electronically Signed   By: Danae Orleans M.D.   On: 08/16/2019 18:15   CT Cervical Spine Wo Contrast  Result Date: 08/24/2019 CLINICAL DATA:  Multiple trauma secondary to a fall on 08/16/2019. Bruising to the face and neck. EXAM: CT HEAD WITHOUT CONTRAST CT MAXILLOFACIAL WITHOUT CONTRAST CT CERVICAL SPINE WITHOUT CONTRAST TECHNIQUE: Multidetector CT imaging of the head, cervical spine, and maxillofacial structures were performed using the standard protocol without intravenous contrast. Multiplanar CT image reconstructions of the cervical spine and maxillofacial structures were also generated. COMPARISON:  CT scans dated 08/16/2019 FINDINGS: CT HEAD FINDINGS Brain: No evidence of acute infarction, hemorrhage, hydrocephalus, extra-axial collection or mass lesion/mass effect. There is moderate cerebral cortical atrophy with asymmetric dilatation of the lateral ventricles. The atrophy is most severe in the temporal lobes. No significant change since the prior study. Diffuse periventricular white matter lucency consistent with chronic small vessel ischemic disease. Vascular: No hyperdense vessel or unexpected calcification. Skull: The skull is intact. There are facial bone fractures described below. Other: None CT MAXILLOFACIAL FINDINGS Osseous: Again noted are fractures of the anterior and lateral walls of the left maxillary sinus and of the floor of the left orbit. The previously described fracture of the anterior wall of the right maxillary sinus is not apparent on the current study. No other acute abnormality. Severe arthritic changes of the left mandibular condyle, chronic. Orbits: No acute abnormality of the orbits. Again noted is the slightly depressed fracture of the floor of the left orbit without entrapment of the inferior rectus muscle. Sinuses: There is persistent opacification of the left maxillary sinus by hemorrhage. The previously noted hemorrhage into the right maxillary sinus has  almost completely resolved. The ethmoid air cells and frontal sinus and mastoid air cells and middle ear cavities are clear. Soft tissues: Interval marked decrease in the soft tissue contusion of the left cheek seen on the prior study. No new soft tissue abnormalities. CT CERVICAL SPINE FINDINGS Alignment: Normal. Skull base and vertebrae: No acute fracture. No primary bone lesion or focal pathologic process. Soft tissues and spinal canal: No prevertebral fluid or swelling. No visible canal hematoma. Disc levels: There is no significant disc bulging or disc protrusion in the cervical spine. There is moderate right facet arthritis at C3-4 moderate left facet arthritis at C4-5. No significant foraminal stenosis. Upper chest: Negative. Other: None IMPRESSION: 1. No acute intracranial abnormality. Atrophy with chronic small vessel ischemic disease. 2. No significant abnormality of the cervical spine. 3. Fractures of the anterior and lateral walls of the left maxillary sinus and floor of the left orbit, unchanged. 4. Decreased soft tissue swelling of the left cheek. Decreased hemorrhage into the right axillary sinus. Electronically Signed   By: Francene Boyers M.D.   On: 08/24/2019 12:51   CT Cervical Spine Wo Contrast  Result Date: 08/16/2019 CLINICAL DATA:  Fall from standing height into door frame. EXAM: CT HEAD WITHOUT CONTRAST CT MAXILLOFACIAL WITHOUT CONTRAST CT CERVICAL SPINE WITHOUT CONTRAST TECHNIQUE: Multidetector CT imaging of the head, cervical spine, and maxillofacial structures were performed using the standard protocol without intravenous contrast. Multiplanar CT image reconstructions of  the cervical spine and maxillofacial structures were also generated. COMPARISON:  None. FINDINGS: Moderate motion artifact throughout the exam as multiple images had to be repeated. CT HEAD FINDINGS Brain: Ventricles, cisterns and CSF spaces are mildly prominent compatible with age related atrophy. There is chronic  ischemic microvascular disease. There is no mass, mass effect, shift of midline structures or acute hemorrhage. Evidence of acute infarction Vascular: No hyperdense vessel or unexpected calcification. Skull: No evidence of skull fracture. Displaced fractures of the anterolateral walls of the left maxillary sinus and left orbital floor. Other: Left periorbital soft tissue swelling. CT MAXILLOFACIAL FINDINGS Osseous: Examination demonstrates displaced comminuted fractures of the anterior wall left maxillary sinus/left orbital floor. No entrapment of left intraorbital contents. There is minimally displaced comminuted fracture of the lateral wall of the left maxillary sinus. Subtle nondisplaced fracture involving the anterior wall of the right maxillary sinus. Subtle fracture along the posterior aspect of the lateral wall of the left orbit. Moderate degenerative changes of the temporomandibular joints bilaterally. Orbits: Globes are normal and symmetric. Retrobulbar spaces are normal. Moderate left periorbital soft tissue swelling. Depressed slightly comminuted fracture of the left orbital floor as described above. No entrapment of orbital contents. Subtle fracture of the posterior aspect of the lateral wall of the left orbit. Sinuses: Moderate opacification throughout the maxillary sinuses likely hemorrhagic debris. Mastoid air cells are clear. Deviation of the nasal septum to the right. Left maxillary sinus wall fractures as described. Soft tissues: Moderate soft tissue swelling over the left mid to lower face and periorbital region. CT CERVICAL SPINE FINDINGS Alignment: No posttraumatic subluxation. Skull base and vertebrae: Atlantoaxial articulation is unremarkable. There is uncovertebral joint spurring and facet arthropathy. No definite acute fracture. Mild to moderate spondylosis of the cervical spine. Vertebral body heights are maintained. Soft tissues and spinal canal: No prevertebral fluid or swelling. No  visible canal hematoma. Disc levels: Mild disc space narrowing is present at the C6-7 level. Upper chest: No acute findings. Other: None. IMPRESSION: 1.  No acute brain injury. 2. Chronic ischemic microvascular disease and age related atrophic change. 3. Multiple acute facial bone fractures as described above involving the lateral wall and inferior floor of the left orbit, anterior and lateral walls of the left maxillary sinus and anterior wall of the right maxillary sinus. Associated soft tissue swelling over the left face and periorbital region. Hemorrhagic debris within the maxillary sinuses. 4.  No acute cervical spine injury. 5. Mild to moderate spondylosis of the cervical spine with disc disease at the C6-7 level. Electronically Signed   By: Elberta Fortis M.D.   On: 08/16/2019 17:11   MR BRAIN WO CONTRAST  Result Date: 08/25/2019 CLINICAL DATA:  Encephalopathy. Facial trauma secondary to recent fall. The examination had to be discontinued prior to completion due to extreme patient motion despite attempts at sedation. EXAM: MRI HEAD WITHOUT CONTRAST TECHNIQUE: Multiplanar, multiecho pulse sequences of the brain and surrounding structures were obtained without intravenous contrast. COMPARISON:  CT head and face without contrast 08/24/2019 08/16/2019 FINDINGS: Brain: Only sagittal T1 weighted images diffusion-weighted images are obtained. These are significantly degraded by patient motion. No acute infarct is evident. No definite mass lesion or hematoma is present. Moderate atrophy is noted. IMPRESSION: Severe motion degraded study without evidence for acute infarct, hemorrhage, or mass. Detailed evaluation for causes of encephalopathy is limited. Electronically Signed   By: Marin Roberts M.D.   On: 08/25/2019 13:35   CT ABDOMEN PELVIS W CONTRAST  Result Date: 08/16/2019  CLINICAL DATA:  Fall. Chest and abdominal trauma and pain. Initial encounter. EXAM: CT CHEST, ABDOMEN, AND PELVIS WITH CONTRAST  TECHNIQUE: Multidetector CT imaging of the chest, abdomen and pelvis was performed following the standard protocol during bolus administration of intravenous contrast. CONTRAST:  63mL OMNIPAQUE IOHEXOL 300 MG/ML  SOLN COMPARISON:  None. FINDINGS: CT CHEST FINDINGS Cardiovascular: No evidence of thoracic aortic injury or mediastinal hematoma. No pericardial effusion. Aortic and coronary artery atherosclerosis incidentally noted. Mediastinum/Nodes: No evidence of pneumomediastinum. No masses or pathologically enlarged lymph nodes identified. Lungs/Pleura: No evidence of pulmonary contusion. Mild atelectasis seen in the dependent portions of the lower lobes. No evidence of pneumothorax or hemothorax. A 4 mm pulmonary nodule is seen in the posterior left upper lobe on image 28/series 4. Musculoskeletal: No acute fractures or suspicious bone lesions identified. CT ABDOMEN PELVIS FINDINGS Hepatobiliary: No hepatic laceration or mass identified. Mild-to-moderate diffuse hepatic steatosis is seen. Gallstones are seen, however there is no evidence of cholecystitis or biliary dilatation. Pancreas: No parenchymal laceration, mass, or inflammatory changes identified. Spleen: No evidence of splenic laceration. Adrenal/Urinary Tract: No hemorrhage or parenchymal lacerations identified. Tiny cyst noted in upper pole of right kidney. No evidence of mass or hydronephrosis. Unremarkable unopacified urinary bladder. Stomach/Bowel: Unopacified bowel loops are unremarkable in appearance. No evidence of hemoperitoneum. Diverticulosis is seen mainly involving the sigmoid colon, however there is no evidence of diverticulitis. Vascular/Lymphatic: No evidence of abdominal aortic injury or retroperitoneal hemorrhage. No pathologically enlarged lymph nodes identified. Aortic atherosclerosis incidentally noted. Reproductive:  No mass or other significant abnormality identified. Other:  None. Musculoskeletal: No acute fractures or suspicious  bone lesions identified. IMPRESSION: 1. No evidence of traumatic injury or other acute findings within the chest, abdomen, or pelvis. 2. 4 mm indeterminate left upper lobe pulmonary nodule. No follow-up needed if patient is low-risk. Non-contrast chest CT can be considered in 12 months if patient is high-risk. This recommendation follows the consensus statement: Guidelines for Management of Incidental Pulmonary Nodules Detected on CT Images: From the Fleischner Society 2017; Radiology 2017; 284:228-243. 3. Hepatic steatosis and cholelithiasis. No radiographic evidence of cholecystitis. 4. Colonic diverticulosis, without radiographic evidence of diverticulitis. Electronically Signed   By: Marlaine Hind M.D.   On: 08/16/2019 18:15   DG Shoulder Left  Result Date: 08/16/2019 CLINICAL DATA:  Pain EXAM: LEFT SHOULDER - 2+ VIEW COMPARISON:  None. FINDINGS: There is no acute displaced fracture. No dislocation. Moderate degenerative changes are noted of the left glenohumeral joint. There are old healed left-sided rib fractures. IMPRESSION: Negative. Electronically Signed   By: Constance Holster M.D.   On: 08/16/2019 16:17   DG Humerus Left  Result Date: 08/16/2019 CLINICAL DATA:  Pain status post fall EXAM: LEFT HUMERUS - 2+ VIEW COMPARISON:  None. FINDINGS: There is no evidence of fracture or other focal bone lesions. Soft tissues are unremarkable. IMPRESSION: Negative. Electronically Signed   By: Constance Holster M.D.   On: 08/16/2019 16:22   CT Maxillofacial Wo Contrast  Result Date: 08/24/2019 CLINICAL DATA:  Multiple trauma secondary to a fall on 08/16/2019. Bruising to the face and neck. EXAM: CT HEAD WITHOUT CONTRAST CT MAXILLOFACIAL WITHOUT CONTRAST CT CERVICAL SPINE WITHOUT CONTRAST TECHNIQUE: Multidetector CT imaging of the head, cervical spine, and maxillofacial structures were performed using the standard protocol without intravenous contrast. Multiplanar CT image reconstructions of the cervical  spine and maxillofacial structures were also generated. COMPARISON:  CT scans dated 08/16/2019 FINDINGS: CT HEAD FINDINGS Brain: No evidence of acute infarction, hemorrhage,  hydrocephalus, extra-axial collection or mass lesion/mass effect. There is moderate cerebral cortical atrophy with asymmetric dilatation of the lateral ventricles. The atrophy is most severe in the temporal lobes. No significant change since the prior study. Diffuse periventricular white matter lucency consistent with chronic small vessel ischemic disease. Vascular: No hyperdense vessel or unexpected calcification. Skull: The skull is intact. There are facial bone fractures described below. Other: None CT MAXILLOFACIAL FINDINGS Osseous: Again noted are fractures of the anterior and lateral walls of the left maxillary sinus and of the floor of the left orbit. The previously described fracture of the anterior wall of the right maxillary sinus is not apparent on the current study. No other acute abnormality. Severe arthritic changes of the left mandibular condyle, chronic. Orbits: No acute abnormality of the orbits. Again noted is the slightly depressed fracture of the floor of the left orbit without entrapment of the inferior rectus muscle. Sinuses: There is persistent opacification of the left maxillary sinus by hemorrhage. The previously noted hemorrhage into the right maxillary sinus has almost completely resolved. The ethmoid air cells and frontal sinus and mastoid air cells and middle ear cavities are clear. Soft tissues: Interval marked decrease in the soft tissue contusion of the left cheek seen on the prior study. No new soft tissue abnormalities. CT CERVICAL SPINE FINDINGS Alignment: Normal. Skull base and vertebrae: No acute fracture. No primary bone lesion or focal pathologic process. Soft tissues and spinal canal: No prevertebral fluid or swelling. No visible canal hematoma. Disc levels: There is no significant disc bulging or disc  protrusion in the cervical spine. There is moderate right facet arthritis at C3-4 moderate left facet arthritis at C4-5. No significant foraminal stenosis. Upper chest: Negative. Other: None IMPRESSION: 1. No acute intracranial abnormality. Atrophy with chronic small vessel ischemic disease. 2. No significant abnormality of the cervical spine. 3. Fractures of the anterior and lateral walls of the left maxillary sinus and floor of the left orbit, unchanged. 4. Decreased soft tissue swelling of the left cheek. Decreased hemorrhage into the right axillary sinus. Electronically Signed   By: Francene BoyersJames  Maxwell M.D.   On: 08/24/2019 12:51   CT Maxillofacial Wo Contrast  Result Date: 08/16/2019 CLINICAL DATA:  Fall from standing height into door frame. EXAM: CT HEAD WITHOUT CONTRAST CT MAXILLOFACIAL WITHOUT CONTRAST CT CERVICAL SPINE WITHOUT CONTRAST TECHNIQUE: Multidetector CT imaging of the head, cervical spine, and maxillofacial structures were performed using the standard protocol without intravenous contrast. Multiplanar CT image reconstructions of the cervical spine and maxillofacial structures were also generated. COMPARISON:  None. FINDINGS: Moderate motion artifact throughout the exam as multiple images had to be repeated. CT HEAD FINDINGS Brain: Ventricles, cisterns and CSF spaces are mildly prominent compatible with age related atrophy. There is chronic ischemic microvascular disease. There is no mass, mass effect, shift of midline structures or acute hemorrhage. Evidence of acute infarction Vascular: No hyperdense vessel or unexpected calcification. Skull: No evidence of skull fracture. Displaced fractures of the anterolateral walls of the left maxillary sinus and left orbital floor. Other: Left periorbital soft tissue swelling. CT MAXILLOFACIAL FINDINGS Osseous: Examination demonstrates displaced comminuted fractures of the anterior wall left maxillary sinus/left orbital floor. No entrapment of left  intraorbital contents. There is minimally displaced comminuted fracture of the lateral wall of the left maxillary sinus. Subtle nondisplaced fracture involving the anterior wall of the right maxillary sinus. Subtle fracture along the posterior aspect of the lateral wall of the left orbit. Moderate degenerative changes of the temporomandibular  joints bilaterally. Orbits: Globes are normal and symmetric. Retrobulbar spaces are normal. Moderate left periorbital soft tissue swelling. Depressed slightly comminuted fracture of the left orbital floor as described above. No entrapment of orbital contents. Subtle fracture of the posterior aspect of the lateral wall of the left orbit. Sinuses: Moderate opacification throughout the maxillary sinuses likely hemorrhagic debris. Mastoid air cells are clear. Deviation of the nasal septum to the right. Left maxillary sinus wall fractures as described. Soft tissues: Moderate soft tissue swelling over the left mid to lower face and periorbital region. CT CERVICAL SPINE FINDINGS Alignment: No posttraumatic subluxation. Skull base and vertebrae: Atlantoaxial articulation is unremarkable. There is uncovertebral joint spurring and facet arthropathy. No definite acute fracture. Mild to moderate spondylosis of the cervical spine. Vertebral body heights are maintained. Soft tissues and spinal canal: No prevertebral fluid or swelling. No visible canal hematoma. Disc levels: Mild disc space narrowing is present at the C6-7 level. Upper chest: No acute findings. Other: None. IMPRESSION: 1.  No acute brain injury. 2. Chronic ischemic microvascular disease and age related atrophic change. 3. Multiple acute facial bone fractures as described above involving the lateral wall and inferior floor of the left orbit, anterior and lateral walls of the left maxillary sinus and anterior wall of the right maxillary sinus. Associated soft tissue swelling over the left face and periorbital region.  Hemorrhagic debris within the maxillary sinuses. 4.  No acute cervical spine injury. 5. Mild to moderate spondylosis of the cervical spine with disc disease at the C6-7 level. Electronically Signed   By: Elberta Fortisaniel  Boyle M.D.   On: 08/16/2019 17:11    Catarina Hartshornavid Marilin Kofman, DO  [facility] Pager [phone]  If 7PM-7AM, please contact night-coverage [email or web address] Password [email or web address] 08/26/2019, 6:12 PM   LOS: 1 day

## 2019-08-27 DIAGNOSIS — N179 Acute kidney failure, unspecified: Principal | ICD-10-CM

## 2019-08-27 DIAGNOSIS — N1832 Chronic kidney disease, stage 3b: Secondary | ICD-10-CM

## 2019-08-27 LAB — BASIC METABOLIC PANEL
Anion gap: 6 (ref 5–15)
BUN: 29 mg/dL — ABNORMAL HIGH (ref 8–23)
CO2: 21 mmol/L — ABNORMAL LOW (ref 22–32)
Calcium: 8.6 mg/dL — ABNORMAL LOW (ref 8.9–10.3)
Chloride: 111 mmol/L (ref 98–111)
Creatinine, Ser: 0.93 mg/dL (ref 0.44–1.00)
GFR calc Af Amer: >60 mL/min (ref >60)
GFR calc non Af Amer: 53 mL/min — ABNORMAL LOW (ref >60)
Glucose, Bld: 101 mg/dL — ABNORMAL HIGH (ref 70–99)
Potassium: 3.5 mmol/L (ref 3.5–5.1)
Sodium: 138 mmol/L (ref 135–145)

## 2019-08-27 LAB — MAGNESIUM: Magnesium: 1.7 mg/dL (ref 1.7–2.4)

## 2019-08-27 LAB — GLUCOSE, CAPILLARY
Glucose-Capillary: 87 mg/dL (ref 70–99)
Glucose-Capillary: 100 mg/dL — ABNORMAL HIGH (ref 70–99)
Glucose-Capillary: 99 mg/dL (ref 70–99)

## 2019-08-27 MED ORDER — MAGNESIUM SULFATE 2 GM/50ML IV SOLN
2.0000 g | Freq: Once | INTRAVENOUS | Status: AC
  Administered 2019-08-27: 11:00:00 2 g via INTRAVENOUS
  Filled 2019-08-27: qty 50

## 2019-08-27 NOTE — TOC Transition Note (Signed)
Transition of Care [facility]) - CM/SW Discharge Note   Patient Details  Name: Christina Ball MRN: [id number] Date of Birth: [birth year]  [facility] CM/SW Contact:  Ewing Schlein, LCSW Phone Number: 08/27/2019, 10:52 AM   Clinical Narrative:   Patient is discharging back to [facility] today. D/C clinicals faxed to [facility]. HH arranged through [facility]. Marcelino Duster of [facility] to pick up patient today.  Final next level of care: Assisted Living Barriers to Discharge: Barriers Resolved   Discharge Plan and Services In-house Referral: Clinical Social Work   Post Acute Care Choice: Home Health          HH Arranged: [facility] Agency: [facility] Date [facility] Agency Contacted: 08/27/19 Time HH Agency Contacted: [phone] Representative spoke with at [facility] Agency: Sarah  Readmission Risk Interventions No flowsheet data found.

## 2019-08-27 NOTE — Evaluation (Signed)
Physical Therapy Evaluation Patient Details Name: Christina Ball MRN: [id number] DOB: 1924-02-02 Today's Date: 08/27/2019   History of Present Illness  Savaya Hakes  is a 86 y.o. female, medical history of angina pectoris, diabetes mellitus, hyperlipidemia, hypertension, osteoporosis, patient is a resident at [facility] unit, history was obtained from [place] niece(NOK), medical records and ED staff, patient with baseline dementia, recently moved from [place] to [place] due to memory unit requirement, patient with recent ED visit 1/31 secondary to fall, with significant head bruising, patient was brought from facility secondary to decreased responsiveness, it started yesterday evening, and progressed today, patient is not responding, vital signs are stable, so she was sent to ED for evaluation, usually with baseline dementia, ambulates with a walker, CT T head was negative on 1/31 upon her fall, no history of seizures, blood sugar was noted to be 300 by EMS, apparently patient was started on Mobic and baclofen for her injuries, but has not taking them for the last few days(last dose 2/5)    Clinical Impression  Patient has difficulty with balance during ambulation secondary to constant drifting to the right, wide base of support, frequently requiring tactile cueing to avoid loss of balance and limited secondary to c/o fatigue.  Patient put back to bed after therapy.  Plan: patient to be discharged today and discharged from physical therapy to care of nursing for out of bed as tolerated for length of stay.    Follow Up Recommendations SNF;Supervision for mobility/OOB;Supervision/Assistance - 24 hour    Equipment Recommendations  None recommended by PT    Recommendations for Other Services       Precautions / Restrictions Precautions Precautions: Fall Restrictions Weight Bearing Restrictions: No      Mobility  Bed Mobility Overal bed mobility: Needs  Assistance Bed Mobility: Supine to Sit;Sit to Supine     Supine to sit: Min assist Sit to supine: Min assist   General bed mobility comments: increased time, labored movement  Transfers Overall transfer level: Needs assistance Equipment used: Rolling walker (2 wheeled) Transfers: Sit to/from [facility] Sit to Stand: Min assist;Mod assist Stand pivot transfers: Min assist;Mod assist       General transfer comment: unsteady on feet, labored movement  Ambulation/Gait Ambulation/Gait assistance: Min assist;Mod assist Gait Distance (Feet): 30 Feet Assistive device: Rolling walker (2 wheeled) Gait Pattern/deviations: Decreased step length - right;Decreased step length - left;Decreased stride length;Trunk flexed Gait velocity: decreased   General Gait Details: slow unsteady cadence with frequent drifting to the right requring tactile cue to avoid loss of balance, limited secondary to fatigue  Stairs            Wheelchair Mobility    Modified Rankin (Stroke Patients Only)       Balance Overall balance assessment: Needs assistance Sitting-balance support: Feet supported;No upper extremity supported Sitting balance-Leahy Scale: Fair Sitting balance - Comments: seated at EOB   Standing balance support: During functional activity;Bilateral upper extremity supported Standing balance-Leahy Scale: Fair Standing balance comment: fair using RW                             Pertinent Vitals/Pain Pain Assessment: No/denies pain    Home Living Family/patient expects to be discharged to:: Assisted living               Home Equipment: Walker - 2 wheels      Prior Function Level of Independence: Needs assistance  Gait / Transfers Assistance Needed: household ambulator with RW  ADL's / Homemaking Assistance Needed: assisted by ALF staff        Hand Dominance        Extremity/Trunk Assessment   Upper Extremity Assessment Upper  Extremity Assessment: Generalized weakness    Lower Extremity Assessment Lower Extremity Assessment: Generalized weakness    Cervical / Trunk Assessment Cervical / Trunk Assessment: Kyphotic  Communication   Communication: No difficulties  Cognition Arousal/Alertness: Awake/alert Behavior During Therapy: WFL for tasks assessed/performed;Flat affect Overall Cognitive Status: History of cognitive impairments - at baseline                                        General Comments      Exercises     Assessment/Plan    PT Assessment All further PT needs can be met in the next venue of care  PT Problem List Decreased strength;Decreased activity tolerance;Decreased balance;Decreased mobility       PT Treatment Interventions      PT Goals (Current goals can be found in the Care Plan section)  Acute Rehab PT Goals Patient Stated Goal: return home PT Goal Formulation: With patient Time For Goal Achievement: 08/27/19 Potential to Achieve Goals: Good    Frequency     Barriers to discharge        Co-evaluation               AM-PAC PT "6 Clicks" Mobility  Outcome Measure Help needed turning from your back to your side while in a flat bed without using bedrails?: A Little Help needed moving from lying on your back to sitting on the side of a flat bed without using bedrails?: A Little Help needed moving to and from a bed to a chair (including a wheelchair)?: A Little Help needed standing up from a chair using your arms (e.g., wheelchair or bedside chair)?: A Lot Help needed to walk in hospital room?: A Lot Help needed climbing 3-5 steps with a railing? : A Lot 6 Click Score: 15    End of Session   Activity Tolerance: Patient tolerated treatment well;Patient limited by fatigue Patient left: in bed;with call bell/phone within reach;with bed alarm set Nurse Communication: Mobility status PT Visit Diagnosis: Unsteadiness on feet (R26.81);Other  abnormalities of gait and mobility (R26.89);Muscle weakness (generalized) (M62.81)    Time: 6728-9791 PT Time Calculation (min) (ACUTE ONLY): 27 min   Charges:   PT Evaluation $PT Eval Moderate Complexity: 1 Mod PT Treatments $Therapeutic Activity: 23-37 mins        12:22 PM, 08/27/19 Lonell Grandchild, MPT Physical Therapist with [facility] [phone] office [phone] mobile phone

## 2019-08-27 NOTE — Discharge Summary (Signed)
Physician Discharge Summary  Rabecka Brendel ZOX:[id number] DOB: Oct 16, 1923 DOA: 08/24/2019  PCP: Patient, No Pcp Per  Admit date: 08/24/2019 Discharge date: 08/27/2019  Admitted From: Home Disposition:  Home   Recommendations for Outpatient Follow-up:  1. Follow up with PCP in 1-2 weeks 2. Please obtain BMP/CBC in one week   Home Health: [facility]   Discharge Condition: Stable CODE STATUS: DNR Diet recommendation: Heart Healthy    Brief/Interim Summary: 86 y.o.female,medical history of angina pectoris, diabetes mellitus, hyperlipidemia, hypertension, osteoporosis, patient is a resident at [facility] unit, history was obtained fromGreatniece(NOK), medical records and ED staff,patient with baseline dementia, recently moved from [facility] to [place] due to memory unit requirement,patient with recent ED visit 1/31 secondary to fall, with significant head bruising,patient was brought from facility secondary to decreased responsiveness, it started yesterday evening, and progressed today, patient is not responding, vital signs are stable, so she was sent to ED for evaluation, usually with baseline dementia, ambulates with a walker, CT T head was negative on 1/31 upon her fall, no history of seizures, blood sugar was noted to be 300 by EMS,apparently patient was started on Mobic and baclofen for her injuries, but has not taking them for the last few days(last dose 2/5)  Discharge Diagnoses:   1. Acute metabolic encephalopathy -secondary to urinary retention and dehydration/AKI and possibly baclofen usage -MRI brain negative for acute findings -08/26/19 great niece at bedside states mental status back to baseline--pleasantly confused A&Ox1 at baseline -ammonia 17 -UA--no pyuria 2. Dementia without behavioral disturbance -having sundowning episodes  3. Essential hypertension  -holding losartan -BP remains acceptable-->do not plan to restart losartan 4. Diabetes  mellitus, type 2 -holding metformin--do not plan to restart -CBGs acceptable 5. Acute on chronic renal failure--CKD stage 3b -baseline creatinine 0.9-1.1 -due to volume depletion in the setting of daily mobic usage -serum creatinine peaked 1.48 -am BMP   Discharge Instructions   Allergies as of 08/27/2019   No Known Allergies     Medication List    STOP taking these medications   baclofen 10 MG tablet Commonly known as: LIORESAL   cephALEXin 500 MG capsule Commonly known as: KEFLEX   losartan 25 MG tablet Commonly known as: COZAAR   meloxicam 15 MG tablet Commonly known as: MOBIC   metFORMIN 500 MG tablet Commonly known as: GLUCOPHAGE     TAKE these medications   acetaminophen 500 MG tablet Commonly known as: TYLENOL Take 500 mg by mouth every 6 (six) hours as needed for mild pain or moderate pain.   Melatonin 3 MG Tabs Take 3 mg by mouth at bedtime.   nystatin powder Commonly known as: MYCOSTATIN/NYSTOP Apply 1 application topically 2 (two) times daily. Applied under breasts for rash   sertraline 50 MG tablet Commonly known as: ZOLOFT Take 75 mg by mouth every morning.       No Known Allergies  Consultations:  none   Procedures/Studies: DG Chest 1 View  Result Date: 08/24/2019 CLINICAL DATA:  History of previous fall with altered mental status EXAM: CHEST  1 VIEW COMPARISON:  08/15/2018 FINDINGS: Cardiac shadow is stable. No vascular congestion is seen. The lungs are well aerated bilaterally. Previously seen density in the left mid lung has resolved in the interval. No bony abnormality is seen. Old rib fractures are noted on the left. IMPRESSION: Interval resolution of previously seen left-sided lung density. No acute abnormality seen. Electronically Signed   By: Alcide Clever M.D.   On: 08/24/2019 12:09  DG Chest 1 View  Result Date: 08/16/2019 CLINICAL DATA:  Pain EXAM: CHEST  1 VIEW COMPARISON:  None. FINDINGS: There is an airspace opacity  overlying the left mid lung zone. The lung volumes are low. Kerley B lines are noted. The heart size is normal. There is a small left-sided pleural effusion. There is no pneumothorax. There are multiple age-indeterminate left-sided rib fractures, several which are suspicious for acute fractures. Aortic calcifications are noted. IMPRESSION: 1. Possible acute left-sided rib fractures. Correlation with physical exam is recommended. A dedicated left rib series may be useful for further evaluation. 2. Small left-sided pleural effusion.  No pneumothorax. 3. Airspace opacity in the left mid lung zone of unknown clinical significance. This could represent an infiltrate or pulmonary contusion. A follow-up chest x-ray is recommended in 4-6 weeks to confirm resolution of this finding. 4. Low lung volumes.  Probable mild interstitial edema. Electronically Signed   By: Katherine Mantle M.D.   On: 08/16/2019 16:20   DG Pelvis 1-2 Views  Result Date: 08/16/2019 CLINICAL DATA:  Pain status post fall EXAM: PELVIS - 1-2 VIEW COMPARISON:  None. FINDINGS: There is no evidence of pelvic fracture or diastasis. No pelvic bone lesions are seen. IMPRESSION: Negative. Electronically Signed   By: Katherine Mantle M.D.   On: 08/16/2019 16:21   CT Head Wo Contrast  Result Date: 08/24/2019 CLINICAL DATA:  Multiple trauma secondary to a fall on 08/16/2019. Bruising to the face and neck. EXAM: CT HEAD WITHOUT CONTRAST CT MAXILLOFACIAL WITHOUT CONTRAST CT CERVICAL SPINE WITHOUT CONTRAST TECHNIQUE: Multidetector CT imaging of the head, cervical spine, and maxillofacial structures were performed using the standard protocol without intravenous contrast. Multiplanar CT image reconstructions of the cervical spine and maxillofacial structures were also generated. COMPARISON:  CT scans dated 08/16/2019 FINDINGS: CT HEAD FINDINGS Brain: No evidence of acute infarction, hemorrhage, hydrocephalus, extra-axial collection or mass lesion/mass effect.  There is moderate cerebral cortical atrophy with asymmetric dilatation of the lateral ventricles. The atrophy is most severe in the temporal lobes. No significant change since the prior study. Diffuse periventricular white matter lucency consistent with chronic small vessel ischemic disease. Vascular: No hyperdense vessel or unexpected calcification. Skull: The skull is intact. There are facial bone fractures described below. Other: None CT MAXILLOFACIAL FINDINGS Osseous: Again noted are fractures of the anterior and lateral walls of the left maxillary sinus and of the floor of the left orbit. The previously described fracture of the anterior wall of the right maxillary sinus is not apparent on the current study. No other acute abnormality. Severe arthritic changes of the left mandibular condyle, chronic. Orbits: No acute abnormality of the orbits. Again noted is the slightly depressed fracture of the floor of the left orbit without entrapment of the inferior rectus muscle. Sinuses: There is persistent opacification of the left maxillary sinus by hemorrhage. The previously noted hemorrhage into the right maxillary sinus has almost completely resolved. The ethmoid air cells and frontal sinus and mastoid air cells and middle ear cavities are clear. Soft tissues: Interval marked decrease in the soft tissue contusion of the left cheek seen on the prior study. No new soft tissue abnormalities. CT CERVICAL SPINE FINDINGS Alignment: Normal. Skull base and vertebrae: No acute fracture. No primary bone lesion or focal pathologic process. Soft tissues and spinal canal: No prevertebral fluid or swelling. No visible canal hematoma. Disc levels: There is no significant disc bulging or disc protrusion in the cervical spine. There is moderate right facet arthritis at C3-4  moderate left facet arthritis at C4-5. No significant foraminal stenosis. Upper chest: Negative. Other: None IMPRESSION: 1. No acute intracranial abnormality.  Atrophy with chronic small vessel ischemic disease. 2. No significant abnormality of the cervical spine. 3. Fractures of the anterior and lateral walls of the left maxillary sinus and floor of the left orbit, unchanged. 4. Decreased soft tissue swelling of the left cheek. Decreased hemorrhage into the right axillary sinus. Electronically Signed   By: Francene Boyers M.D.   On: 08/24/2019 12:51   CT Head Wo Contrast  Result Date: 08/16/2019 CLINICAL DATA:  Fall from standing height into door frame. EXAM: CT HEAD WITHOUT CONTRAST CT MAXILLOFACIAL WITHOUT CONTRAST CT CERVICAL SPINE WITHOUT CONTRAST TECHNIQUE: Multidetector CT imaging of the head, cervical spine, and maxillofacial structures were performed using the standard protocol without intravenous contrast. Multiplanar CT image reconstructions of the cervical spine and maxillofacial structures were also generated. COMPARISON:  None. FINDINGS: Moderate motion artifact throughout the exam as multiple images had to be repeated. CT HEAD FINDINGS Brain: Ventricles, cisterns and CSF spaces are mildly prominent compatible with age related atrophy. There is chronic ischemic microvascular disease. There is no mass, mass effect, shift of midline structures or acute hemorrhage. Evidence of acute infarction Vascular: No hyperdense vessel or unexpected calcification. Skull: No evidence of skull fracture. Displaced fractures of the anterolateral walls of the left maxillary sinus and left orbital floor. Other: Left periorbital soft tissue swelling. CT MAXILLOFACIAL FINDINGS Osseous: Examination demonstrates displaced comminuted fractures of the anterior wall left maxillary sinus/left orbital floor. No entrapment of left intraorbital contents. There is minimally displaced comminuted fracture of the lateral wall of the left maxillary sinus. Subtle nondisplaced fracture involving the anterior wall of the right maxillary sinus. Subtle fracture along the posterior aspect of the  lateral wall of the left orbit. Moderate degenerative changes of the temporomandibular joints bilaterally. Orbits: Globes are normal and symmetric. Retrobulbar spaces are normal. Moderate left periorbital soft tissue swelling. Depressed slightly comminuted fracture of the left orbital floor as described above. No entrapment of orbital contents. Subtle fracture of the posterior aspect of the lateral wall of the left orbit. Sinuses: Moderate opacification throughout the maxillary sinuses likely hemorrhagic debris. Mastoid air cells are clear. Deviation of the nasal septum to the right. Left maxillary sinus wall fractures as described. Soft tissues: Moderate soft tissue swelling over the left mid to lower face and periorbital region. CT CERVICAL SPINE FINDINGS Alignment: No posttraumatic subluxation. Skull base and vertebrae: Atlantoaxial articulation is unremarkable. There is uncovertebral joint spurring and facet arthropathy. No definite acute fracture. Mild to moderate spondylosis of the cervical spine. Vertebral body heights are maintained. Soft tissues and spinal canal: No prevertebral fluid or swelling. No visible canal hematoma. Disc levels: Mild disc space narrowing is present at the C6-7 level. Upper chest: No acute findings. Other: None. IMPRESSION: 1.  No acute brain injury. 2. Chronic ischemic microvascular disease and age related atrophic change. 3. Multiple acute facial bone fractures as described above involving the lateral wall and inferior floor of the left orbit, anterior and lateral walls of the left maxillary sinus and anterior wall of the right maxillary sinus. Associated soft tissue swelling over the left face and periorbital region. Hemorrhagic debris within the maxillary sinuses. 4.  No acute cervical spine injury. 5. Mild to moderate spondylosis of the cervical spine with disc disease at the C6-7 level. Electronically Signed   By: Elberta Fortis M.D.   On: 08/16/2019 17:11   CT Chest  W  Contrast  Result Date: 08/16/2019 CLINICAL DATA:  Fall. Chest and abdominal trauma and pain. Initial encounter. EXAM: CT CHEST, ABDOMEN, AND PELVIS WITH CONTRAST TECHNIQUE: Multidetector CT imaging of the chest, abdomen and pelvis was performed following the standard protocol during bolus administration of intravenous contrast. CONTRAST:  80mL OMNIPAQUE IOHEXOL 300 MG/ML  SOLN COMPARISON:  None. FINDINGS: CT CHEST FINDINGS Cardiovascular: No evidence of thoracic aortic injury or mediastinal hematoma. No pericardial effusion. Aortic and coronary artery atherosclerosis incidentally noted. Mediastinum/Nodes: No evidence of pneumomediastinum. No masses or pathologically enlarged lymph nodes identified. Lungs/Pleura: No evidence of pulmonary contusion. Mild atelectasis seen in the dependent portions of the lower lobes. No evidence of pneumothorax or hemothorax. A 4 mm pulmonary nodule is seen in the posterior left upper lobe on image 28/series 4. Musculoskeletal: No acute fractures or suspicious bone lesions identified. CT ABDOMEN PELVIS FINDINGS Hepatobiliary: No hepatic laceration or mass identified. Mild-to-moderate diffuse hepatic steatosis is seen. Gallstones are seen, however there is no evidence of cholecystitis or biliary dilatation. Pancreas: No parenchymal laceration, mass, or inflammatory changes identified. Spleen: No evidence of splenic laceration. Adrenal/Urinary Tract: No hemorrhage or parenchymal lacerations identified. Tiny cyst noted in upper pole of right kidney. No evidence of mass or hydronephrosis. Unremarkable unopacified urinary bladder. Stomach/Bowel: Unopacified bowel loops are unremarkable in appearance. No evidence of hemoperitoneum. Diverticulosis is seen mainly involving the sigmoid colon, however there is no evidence of diverticulitis. Vascular/Lymphatic: No evidence of abdominal aortic injury or retroperitoneal hemorrhage. No pathologically enlarged lymph nodes identified. Aortic  atherosclerosis incidentally noted. Reproductive:  No mass or other significant abnormality identified. Other:  None. Musculoskeletal: No acute fractures or suspicious bone lesions identified. IMPRESSION: 1. No evidence of traumatic injury or other acute findings within the chest, abdomen, or pelvis. 2. 4 mm indeterminate left upper lobe pulmonary nodule. No follow-up needed if patient is low-risk. Non-contrast chest CT can be considered in 12 months if patient is high-risk. This recommendation follows the consensus statement: Guidelines for Management of Incidental Pulmonary Nodules Detected on CT Images: From the Fleischner Society 2017; Radiology 2017; 284:228-243. 3. Hepatic steatosis and cholelithiasis. No radiographic evidence of cholecystitis. 4. Colonic diverticulosis, without radiographic evidence of diverticulitis. Electronically Signed   By: Danae Orleans M.D.   On: 08/16/2019 18:15   CT Cervical Spine Wo Contrast  Result Date: 08/24/2019 CLINICAL DATA:  Multiple trauma secondary to a fall on 08/16/2019. Bruising to the face and neck. EXAM: CT HEAD WITHOUT CONTRAST CT MAXILLOFACIAL WITHOUT CONTRAST CT CERVICAL SPINE WITHOUT CONTRAST TECHNIQUE: Multidetector CT imaging of the head, cervical spine, and maxillofacial structures were performed using the standard protocol without intravenous contrast. Multiplanar CT image reconstructions of the cervical spine and maxillofacial structures were also generated. COMPARISON:  CT scans dated 08/16/2019 FINDINGS: CT HEAD FINDINGS Brain: No evidence of acute infarction, hemorrhage, hydrocephalus, extra-axial collection or mass lesion/mass effect. There is moderate cerebral cortical atrophy with asymmetric dilatation of the lateral ventricles. The atrophy is most severe in the temporal lobes. No significant change since the prior study. Diffuse periventricular white matter lucency consistent with chronic small vessel ischemic disease. Vascular: No hyperdense vessel  or unexpected calcification. Skull: The skull is intact. There are facial bone fractures described below. Other: None CT MAXILLOFACIAL FINDINGS Osseous: Again noted are fractures of the anterior and lateral walls of the left maxillary sinus and of the floor of the left orbit. The previously described fracture of the anterior wall of the right maxillary sinus is not apparent on the  current study. No other acute abnormality. Severe arthritic changes of the left mandibular condyle, chronic. Orbits: No acute abnormality of the orbits. Again noted is the slightly depressed fracture of the floor of the left orbit without entrapment of the inferior rectus muscle. Sinuses: There is persistent opacification of the left maxillary sinus by hemorrhage. The previously noted hemorrhage into the right maxillary sinus has almost completely resolved. The ethmoid air cells and frontal sinus and mastoid air cells and middle ear cavities are clear. Soft tissues: Interval marked decrease in the soft tissue contusion of the left cheek seen on the prior study. No new soft tissue abnormalities. CT CERVICAL SPINE FINDINGS Alignment: Normal. Skull base and vertebrae: No acute fracture. No primary bone lesion or focal pathologic process. Soft tissues and spinal canal: No prevertebral fluid or swelling. No visible canal hematoma. Disc levels: There is no significant disc bulging or disc protrusion in the cervical spine. There is moderate right facet arthritis at C3-4 moderate left facet arthritis at C4-5. No significant foraminal stenosis. Upper chest: Negative. Other: None IMPRESSION: 1. No acute intracranial abnormality. Atrophy with chronic small vessel ischemic disease. 2. No significant abnormality of the cervical spine. 3. Fractures of the anterior and lateral walls of the left maxillary sinus and floor of the left orbit, unchanged. 4. Decreased soft tissue swelling of the left cheek. Decreased hemorrhage into the right axillary sinus.  Electronically Signed   By: Francene Boyers M.D.   On: 08/24/2019 12:51   CT Cervical Spine Wo Contrast  Result Date: 08/16/2019 CLINICAL DATA:  Fall from standing height into door frame. EXAM: CT HEAD WITHOUT CONTRAST CT MAXILLOFACIAL WITHOUT CONTRAST CT CERVICAL SPINE WITHOUT CONTRAST TECHNIQUE: Multidetector CT imaging of the head, cervical spine, and maxillofacial structures were performed using the standard protocol without intravenous contrast. Multiplanar CT image reconstructions of the cervical spine and maxillofacial structures were also generated. COMPARISON:  None. FINDINGS: Moderate motion artifact throughout the exam as multiple images had to be repeated. CT HEAD FINDINGS Brain: Ventricles, cisterns and CSF spaces are mildly prominent compatible with age related atrophy. There is chronic ischemic microvascular disease. There is no mass, mass effect, shift of midline structures or acute hemorrhage. Evidence of acute infarction Vascular: No hyperdense vessel or unexpected calcification. Skull: No evidence of skull fracture. Displaced fractures of the anterolateral walls of the left maxillary sinus and left orbital floor. Other: Left periorbital soft tissue swelling. CT MAXILLOFACIAL FINDINGS Osseous: Examination demonstrates displaced comminuted fractures of the anterior wall left maxillary sinus/left orbital floor. No entrapment of left intraorbital contents. There is minimally displaced comminuted fracture of the lateral wall of the left maxillary sinus. Subtle nondisplaced fracture involving the anterior wall of the right maxillary sinus. Subtle fracture along the posterior aspect of the lateral wall of the left orbit. Moderate degenerative changes of the temporomandibular joints bilaterally. Orbits: Globes are normal and symmetric. Retrobulbar spaces are normal. Moderate left periorbital soft tissue swelling. Depressed slightly comminuted fracture of the left orbital floor as described above. No  entrapment of orbital contents. Subtle fracture of the posterior aspect of the lateral wall of the left orbit. Sinuses: Moderate opacification throughout the maxillary sinuses likely hemorrhagic debris. Mastoid air cells are clear. Deviation of the nasal septum to the right. Left maxillary sinus wall fractures as described. Soft tissues: Moderate soft tissue swelling over the left mid to lower face and periorbital region. CT CERVICAL SPINE FINDINGS Alignment: No posttraumatic subluxation. Skull base and vertebrae: Atlantoaxial articulation is unremarkable. There is  uncovertebral joint spurring and facet arthropathy. No definite acute fracture. Mild to moderate spondylosis of the cervical spine. Vertebral body heights are maintained. Soft tissues and spinal canal: No prevertebral fluid or swelling. No visible canal hematoma. Disc levels: Mild disc space narrowing is present at the C6-7 level. Upper chest: No acute findings. Other: None. IMPRESSION: 1.  No acute brain injury. 2. Chronic ischemic microvascular disease and age related atrophic change. 3. Multiple acute facial bone fractures as described above involving the lateral wall and inferior floor of the left orbit, anterior and lateral walls of the left maxillary sinus and anterior wall of the right maxillary sinus. Associated soft tissue swelling over the left face and periorbital region. Hemorrhagic debris within the maxillary sinuses. 4.  No acute cervical spine injury. 5. Mild to moderate spondylosis of the cervical spine with disc disease at the C6-7 level. Electronically Signed   By: Elberta Fortis M.D.   On: 08/16/2019 17:11   MR BRAIN WO CONTRAST  Result Date: 08/25/2019 CLINICAL DATA:  Encephalopathy. Facial trauma secondary to recent fall. The examination had to be discontinued prior to completion due to extreme patient motion despite attempts at sedation. EXAM: MRI HEAD WITHOUT CONTRAST TECHNIQUE: Multiplanar, multiecho pulse sequences of the brain  and surrounding structures were obtained without intravenous contrast. COMPARISON:  CT head and face without contrast 08/24/2019 08/16/2019 FINDINGS: Brain: Only sagittal T1 weighted images diffusion-weighted images are obtained. These are significantly degraded by patient motion. No acute infarct is evident. No definite mass lesion or hematoma is present. Moderate atrophy is noted. IMPRESSION: Severe motion degraded study without evidence for acute infarct, hemorrhage, or mass. Detailed evaluation for causes of encephalopathy is limited. Electronically Signed   By: Marin Roberts M.D.   On: 08/25/2019 13:35   CT ABDOMEN PELVIS W CONTRAST  Result Date: 08/16/2019 CLINICAL DATA:  Fall. Chest and abdominal trauma and pain. Initial encounter. EXAM: CT CHEST, ABDOMEN, AND PELVIS WITH CONTRAST TECHNIQUE: Multidetector CT imaging of the chest, abdomen and pelvis was performed following the standard protocol during bolus administration of intravenous contrast. CONTRAST:  91mL OMNIPAQUE IOHEXOL 300 MG/ML  SOLN COMPARISON:  None. FINDINGS: CT CHEST FINDINGS Cardiovascular: No evidence of thoracic aortic injury or mediastinal hematoma. No pericardial effusion. Aortic and coronary artery atherosclerosis incidentally noted. Mediastinum/Nodes: No evidence of pneumomediastinum. No masses or pathologically enlarged lymph nodes identified. Lungs/Pleura: No evidence of pulmonary contusion. Mild atelectasis seen in the dependent portions of the lower lobes. No evidence of pneumothorax or hemothorax. A 4 mm pulmonary nodule is seen in the posterior left upper lobe on image 28/series 4. Musculoskeletal: No acute fractures or suspicious bone lesions identified. CT ABDOMEN PELVIS FINDINGS Hepatobiliary: No hepatic laceration or mass identified. Mild-to-moderate diffuse hepatic steatosis is seen. Gallstones are seen, however there is no evidence of cholecystitis or biliary dilatation. Pancreas: No parenchymal laceration, mass,  or inflammatory changes identified. Spleen: No evidence of splenic laceration. Adrenal/Urinary Tract: No hemorrhage or parenchymal lacerations identified. Tiny cyst noted in upper pole of right kidney. No evidence of mass or hydronephrosis. Unremarkable unopacified urinary bladder. Stomach/Bowel: Unopacified bowel loops are unremarkable in appearance. No evidence of hemoperitoneum. Diverticulosis is seen mainly involving the sigmoid colon, however there is no evidence of diverticulitis. Vascular/Lymphatic: No evidence of abdominal aortic injury or retroperitoneal hemorrhage. No pathologically enlarged lymph nodes identified. Aortic atherosclerosis incidentally noted. Reproductive:  No mass or other significant abnormality identified. Other:  None. Musculoskeletal: No acute fractures or suspicious bone lesions identified. IMPRESSION: 1. No evidence  of traumatic injury or other acute findings within the chest, abdomen, or pelvis. 2. 4 mm indeterminate left upper lobe pulmonary nodule. No follow-up needed if patient is low-risk. Non-contrast chest CT can be considered in 12 months if patient is high-risk. This recommendation follows the consensus statement: Guidelines for Management of Incidental Pulmonary Nodules Detected on CT Images: From the Fleischner Society 2017; Radiology 2017; 284:228-243. 3. Hepatic steatosis and cholelithiasis. No radiographic evidence of cholecystitis. 4. Colonic diverticulosis, without radiographic evidence of diverticulitis. Electronically Signed   By: Danae Orleans M.D.   On: 08/16/2019 18:15   DG Shoulder Left  Result Date: 08/16/2019 CLINICAL DATA:  Pain EXAM: LEFT SHOULDER - 2+ VIEW COMPARISON:  None. FINDINGS: There is no acute displaced fracture. No dislocation. Moderate degenerative changes are noted of the left glenohumeral joint. There are old healed left-sided rib fractures. IMPRESSION: Negative. Electronically Signed   By: Katherine Mantle M.D.   On: 08/16/2019 16:17    DG Humerus Left  Result Date: 08/16/2019 CLINICAL DATA:  Pain status post fall EXAM: LEFT HUMERUS - 2+ VIEW COMPARISON:  None. FINDINGS: There is no evidence of fracture or other focal bone lesions. Soft tissues are unremarkable. IMPRESSION: Negative. Electronically Signed   By: Katherine Mantle M.D.   On: 08/16/2019 16:22   CT Maxillofacial Wo Contrast  Result Date: 08/24/2019 CLINICAL DATA:  Multiple trauma secondary to a fall on 08/16/2019. Bruising to the face and neck. EXAM: CT HEAD WITHOUT CONTRAST CT MAXILLOFACIAL WITHOUT CONTRAST CT CERVICAL SPINE WITHOUT CONTRAST TECHNIQUE: Multidetector CT imaging of the head, cervical spine, and maxillofacial structures were performed using the standard protocol without intravenous contrast. Multiplanar CT image reconstructions of the cervical spine and maxillofacial structures were also generated. COMPARISON:  CT scans dated 08/16/2019 FINDINGS: CT HEAD FINDINGS Brain: No evidence of acute infarction, hemorrhage, hydrocephalus, extra-axial collection or mass lesion/mass effect. There is moderate cerebral cortical atrophy with asymmetric dilatation of the lateral ventricles. The atrophy is most severe in the temporal lobes. No significant change since the prior study. Diffuse periventricular white matter lucency consistent with chronic small vessel ischemic disease. Vascular: No hyperdense vessel or unexpected calcification. Skull: The skull is intact. There are facial bone fractures described below. Other: None CT MAXILLOFACIAL FINDINGS Osseous: Again noted are fractures of the anterior and lateral walls of the left maxillary sinus and of the floor of the left orbit. The previously described fracture of the anterior wall of the right maxillary sinus is not apparent on the current study. No other acute abnormality. Severe arthritic changes of the left mandibular condyle, chronic. Orbits: No acute abnormality of the orbits. Again noted is the slightly depressed  fracture of the floor of the left orbit without entrapment of the inferior rectus muscle. Sinuses: There is persistent opacification of the left maxillary sinus by hemorrhage. The previously noted hemorrhage into the right maxillary sinus has almost completely resolved. The ethmoid air cells and frontal sinus and mastoid air cells and middle ear cavities are clear. Soft tissues: Interval marked decrease in the soft tissue contusion of the left cheek seen on the prior study. No new soft tissue abnormalities. CT CERVICAL SPINE FINDINGS Alignment: Normal. Skull base and vertebrae: No acute fracture. No primary bone lesion or focal pathologic process. Soft tissues and spinal canal: No prevertebral fluid or swelling. No visible canal hematoma. Disc levels: There is no significant disc bulging or disc protrusion in the cervical spine. There is moderate right facet arthritis at C3-4 moderate left facet arthritis  at C4-5. No significant foraminal stenosis. Upper chest: Negative. Other: None IMPRESSION: 1. No acute intracranial abnormality. Atrophy with chronic small vessel ischemic disease. 2. No significant abnormality of the cervical spine. 3. Fractures of the anterior and lateral walls of the left maxillary sinus and floor of the left orbit, unchanged. 4. Decreased soft tissue swelling of the left cheek. Decreased hemorrhage into the right axillary sinus. Electronically Signed   By: Francene Boyers M.D.   On: 08/24/2019 12:51   CT Maxillofacial Wo Contrast  Result Date: 08/16/2019 CLINICAL DATA:  Fall from standing height into door frame. EXAM: CT HEAD WITHOUT CONTRAST CT MAXILLOFACIAL WITHOUT CONTRAST CT CERVICAL SPINE WITHOUT CONTRAST TECHNIQUE: Multidetector CT imaging of the head, cervical spine, and maxillofacial structures were performed using the standard protocol without intravenous contrast. Multiplanar CT image reconstructions of the cervical spine and maxillofacial structures were also generated.  COMPARISON:  None. FINDINGS: Moderate motion artifact throughout the exam as multiple images had to be repeated. CT HEAD FINDINGS Brain: Ventricles, cisterns and CSF spaces are mildly prominent compatible with age related atrophy. There is chronic ischemic microvascular disease. There is no mass, mass effect, shift of midline structures or acute hemorrhage. Evidence of acute infarction Vascular: No hyperdense vessel or unexpected calcification. Skull: No evidence of skull fracture. Displaced fractures of the anterolateral walls of the left maxillary sinus and left orbital floor. Other: Left periorbital soft tissue swelling. CT MAXILLOFACIAL FINDINGS Osseous: Examination demonstrates displaced comminuted fractures of the anterior wall left maxillary sinus/left orbital floor. No entrapment of left intraorbital contents. There is minimally displaced comminuted fracture of the lateral wall of the left maxillary sinus. Subtle nondisplaced fracture involving the anterior wall of the right maxillary sinus. Subtle fracture along the posterior aspect of the lateral wall of the left orbit. Moderate degenerative changes of the temporomandibular joints bilaterally. Orbits: Globes are normal and symmetric. Retrobulbar spaces are normal. Moderate left periorbital soft tissue swelling. Depressed slightly comminuted fracture of the left orbital floor as described above. No entrapment of orbital contents. Subtle fracture of the posterior aspect of the lateral wall of the left orbit. Sinuses: Moderate opacification throughout the maxillary sinuses likely hemorrhagic debris. Mastoid air cells are clear. Deviation of the nasal septum to the right. Left maxillary sinus wall fractures as described. Soft tissues: Moderate soft tissue swelling over the left mid to lower face and periorbital region. CT CERVICAL SPINE FINDINGS Alignment: No posttraumatic subluxation. Skull base and vertebrae: Atlantoaxial articulation is unremarkable. There  is uncovertebral joint spurring and facet arthropathy. No definite acute fracture. Mild to moderate spondylosis of the cervical spine. Vertebral body heights are maintained. Soft tissues and spinal canal: No prevertebral fluid or swelling. No visible canal hematoma. Disc levels: Mild disc space narrowing is present at the C6-7 level. Upper chest: No acute findings. Other: None. IMPRESSION: 1.  No acute brain injury. 2. Chronic ischemic microvascular disease and age related atrophic change. 3. Multiple acute facial bone fractures as described above involving the lateral wall and inferior floor of the left orbit, anterior and lateral walls of the left maxillary sinus and anterior wall of the right maxillary sinus. Associated soft tissue swelling over the left face and periorbital region. Hemorrhagic debris within the maxillary sinuses. 4.  No acute cervical spine injury. 5. Mild to moderate spondylosis of the cervical spine with disc disease at the C6-7 level. Electronically Signed   By: Elberta Fortis M.D.   On: 08/16/2019 17:11        Discharge  Exam: Vitals:   08/26/19 2200 08/27/19 0609  BP: (!) 151/67 138/74  Pulse: 79 76  Resp: 20   Temp: 99.4 F (37.4 C) 98.1 F (36.7 C)  SpO2: 95% 96%   Vitals:   08/26/19 1459 08/26/19 2030 08/26/19 2200 08/27/19 0609  BP: (!) 148/57  (!) 151/67 138/74  Pulse: 82  79 76  Resp: 18  20   Temp: 98 F (36.7 C)  99.4 F (37.4 C) 98.1 F (36.7 C)  TempSrc:   Oral Oral  SpO2: 96% 96% 95% 96%  Weight:      Height:        General: Pt is alert, awake, not in acute distress Cardiovascular: RRR, S1/S2 +, no rubs, no gallops Respiratory: bibasilar crackles. No wheeze Abdominal: Soft, NT, ND, bowel sounds + Extremities: no edema, no cyanosis   The results of significant diagnostics from this hospitalization (including imaging, microbiology, ancillary and laboratory) are listed below for reference.    Significant Diagnostic Studies: DG Chest 1  View  Result Date: 08/24/2019 CLINICAL DATA:  History of previous fall with altered mental status EXAM: CHEST  1 VIEW COMPARISON:  08/15/2018 FINDINGS: Cardiac shadow is stable. No vascular congestion is seen. The lungs are well aerated bilaterally. Previously seen density in the left mid lung has resolved in the interval. No bony abnormality is seen. Old rib fractures are noted on the left. IMPRESSION: Interval resolution of previously seen left-sided lung density. No acute abnormality seen. Electronically Signed   By: Alcide Clever M.D.   On: 08/24/2019 12:09   DG Chest 1 View  Result Date: 08/16/2019 CLINICAL DATA:  Pain EXAM: CHEST  1 VIEW COMPARISON:  None. FINDINGS: There is an airspace opacity overlying the left mid lung zone. The lung volumes are low. Kerley B lines are noted. The heart size is normal. There is a small left-sided pleural effusion. There is no pneumothorax. There are multiple age-indeterminate left-sided rib fractures, several which are suspicious for acute fractures. Aortic calcifications are noted. IMPRESSION: 1. Possible acute left-sided rib fractures. Correlation with physical exam is recommended. A dedicated left rib series may be useful for further evaluation. 2. Small left-sided pleural effusion.  No pneumothorax. 3. Airspace opacity in the left mid lung zone of unknown clinical significance. This could represent an infiltrate or pulmonary contusion. A follow-up chest x-ray is recommended in 4-6 weeks to confirm resolution of this finding. 4. Low lung volumes.  Probable mild interstitial edema. Electronically Signed   By: Katherine Mantle M.D.   On: 08/16/2019 16:20   DG Pelvis 1-2 Views  Result Date: 08/16/2019 CLINICAL DATA:  Pain status post fall EXAM: PELVIS - 1-2 VIEW COMPARISON:  None. FINDINGS: There is no evidence of pelvic fracture or diastasis. No pelvic bone lesions are seen. IMPRESSION: Negative. Electronically Signed   By: Katherine Mantle M.D.   On: 08/16/2019  16:21   CT Head Wo Contrast  Result Date: 08/24/2019 CLINICAL DATA:  Multiple trauma secondary to a fall on 08/16/2019. Bruising to the face and neck. EXAM: CT HEAD WITHOUT CONTRAST CT MAXILLOFACIAL WITHOUT CONTRAST CT CERVICAL SPINE WITHOUT CONTRAST TECHNIQUE: Multidetector CT imaging of the head, cervical spine, and maxillofacial structures were performed using the standard protocol without intravenous contrast. Multiplanar CT image reconstructions of the cervical spine and maxillofacial structures were also generated. COMPARISON:  CT scans dated 08/16/2019 FINDINGS: CT HEAD FINDINGS Brain: No evidence of acute infarction, hemorrhage, hydrocephalus, extra-axial collection or mass lesion/mass effect. There is moderate cerebral cortical  atrophy with asymmetric dilatation of the lateral ventricles. The atrophy is most severe in the temporal lobes. No significant change since the prior study. Diffuse periventricular white matter lucency consistent with chronic small vessel ischemic disease. Vascular: No hyperdense vessel or unexpected calcification. Skull: The skull is intact. There are facial bone fractures described below. Other: None CT MAXILLOFACIAL FINDINGS Osseous: Again noted are fractures of the anterior and lateral walls of the left maxillary sinus and of the floor of the left orbit. The previously described fracture of the anterior wall of the right maxillary sinus is not apparent on the current study. No other acute abnormality. Severe arthritic changes of the left mandibular condyle, chronic. Orbits: No acute abnormality of the orbits. Again noted is the slightly depressed fracture of the floor of the left orbit without entrapment of the inferior rectus muscle. Sinuses: There is persistent opacification of the left maxillary sinus by hemorrhage. The previously noted hemorrhage into the right maxillary sinus has almost completely resolved. The ethmoid air cells and frontal sinus and mastoid air cells and  middle ear cavities are clear. Soft tissues: Interval marked decrease in the soft tissue contusion of the left cheek seen on the prior study. No new soft tissue abnormalities. CT CERVICAL SPINE FINDINGS Alignment: Normal. Skull base and vertebrae: No acute fracture. No primary bone lesion or focal pathologic process. Soft tissues and spinal canal: No prevertebral fluid or swelling. No visible canal hematoma. Disc levels: There is no significant disc bulging or disc protrusion in the cervical spine. There is moderate right facet arthritis at C3-4 moderate left facet arthritis at C4-5. No significant foraminal stenosis. Upper chest: Negative. Other: None IMPRESSION: 1. No acute intracranial abnormality. Atrophy with chronic small vessel ischemic disease. 2. No significant abnormality of the cervical spine. 3. Fractures of the anterior and lateral walls of the left maxillary sinus and floor of the left orbit, unchanged. 4. Decreased soft tissue swelling of the left cheek. Decreased hemorrhage into the right axillary sinus. Electronically Signed   By: Francene BoyersJames  Maxwell M.D.   On: 08/24/2019 12:51   CT Head Wo Contrast  Result Date: 08/16/2019 CLINICAL DATA:  Fall from standing height into door frame. EXAM: CT HEAD WITHOUT CONTRAST CT MAXILLOFACIAL WITHOUT CONTRAST CT CERVICAL SPINE WITHOUT CONTRAST TECHNIQUE: Multidetector CT imaging of the head, cervical spine, and maxillofacial structures were performed using the standard protocol without intravenous contrast. Multiplanar CT image reconstructions of the cervical spine and maxillofacial structures were also generated. COMPARISON:  None. FINDINGS: Moderate motion artifact throughout the exam as multiple images had to be repeated. CT HEAD FINDINGS Brain: Ventricles, cisterns and CSF spaces are mildly prominent compatible with age related atrophy. There is chronic ischemic microvascular disease. There is no mass, mass effect, shift of midline structures or acute  hemorrhage. Evidence of acute infarction Vascular: No hyperdense vessel or unexpected calcification. Skull: No evidence of skull fracture. Displaced fractures of the anterolateral walls of the left maxillary sinus and left orbital floor. Other: Left periorbital soft tissue swelling. CT MAXILLOFACIAL FINDINGS Osseous: Examination demonstrates displaced comminuted fractures of the anterior wall left maxillary sinus/left orbital floor. No entrapment of left intraorbital contents. There is minimally displaced comminuted fracture of the lateral wall of the left maxillary sinus. Subtle nondisplaced fracture involving the anterior wall of the right maxillary sinus. Subtle fracture along the posterior aspect of the lateral wall of the left orbit. Moderate degenerative changes of the temporomandibular joints bilaterally. Orbits: Globes are normal and symmetric. Retrobulbar spaces are normal.  Moderate left periorbital soft tissue swelling. Depressed slightly comminuted fracture of the left orbital floor as described above. No entrapment of orbital contents. Subtle fracture of the posterior aspect of the lateral wall of the left orbit. Sinuses: Moderate opacification throughout the maxillary sinuses likely hemorrhagic debris. Mastoid air cells are clear. Deviation of the nasal septum to the right. Left maxillary sinus wall fractures as described. Soft tissues: Moderate soft tissue swelling over the left mid to lower face and periorbital region. CT CERVICAL SPINE FINDINGS Alignment: No posttraumatic subluxation. Skull base and vertebrae: Atlantoaxial articulation is unremarkable. There is uncovertebral joint spurring and facet arthropathy. No definite acute fracture. Mild to moderate spondylosis of the cervical spine. Vertebral body heights are maintained. Soft tissues and spinal canal: No prevertebral fluid or swelling. No visible canal hematoma. Disc levels: Mild disc space narrowing is present at the C6-7 level. Upper chest:  No acute findings. Other: None. IMPRESSION: 1.  No acute brain injury. 2. Chronic ischemic microvascular disease and age related atrophic change. 3. Multiple acute facial bone fractures as described above involving the lateral wall and inferior floor of the left orbit, anterior and lateral walls of the left maxillary sinus and anterior wall of the right maxillary sinus. Associated soft tissue swelling over the left face and periorbital region. Hemorrhagic debris within the maxillary sinuses. 4.  No acute cervical spine injury. 5. Mild to moderate spondylosis of the cervical spine with disc disease at the C6-7 level. Electronically Signed   By: Elberta Fortis M.D.   On: 08/16/2019 17:11   CT Chest W Contrast  Result Date: 08/16/2019 CLINICAL DATA:  Fall. Chest and abdominal trauma and pain. Initial encounter. EXAM: CT CHEST, ABDOMEN, AND PELVIS WITH CONTRAST TECHNIQUE: Multidetector CT imaging of the chest, abdomen and pelvis was performed following the standard protocol during bolus administration of intravenous contrast. CONTRAST:  80mL OMNIPAQUE IOHEXOL 300 MG/ML  SOLN COMPARISON:  None. FINDINGS: CT CHEST FINDINGS Cardiovascular: No evidence of thoracic aortic injury or mediastinal hematoma. No pericardial effusion. Aortic and coronary artery atherosclerosis incidentally noted. Mediastinum/Nodes: No evidence of pneumomediastinum. No masses or pathologically enlarged lymph nodes identified. Lungs/Pleura: No evidence of pulmonary contusion. Mild atelectasis seen in the dependent portions of the lower lobes. No evidence of pneumothorax or hemothorax. A 4 mm pulmonary nodule is seen in the posterior left upper lobe on image 28/series 4. Musculoskeletal: No acute fractures or suspicious bone lesions identified. CT ABDOMEN PELVIS FINDINGS Hepatobiliary: No hepatic laceration or mass identified. Mild-to-moderate diffuse hepatic steatosis is seen. Gallstones are seen, however there is no evidence of cholecystitis or  biliary dilatation. Pancreas: No parenchymal laceration, mass, or inflammatory changes identified. Spleen: No evidence of splenic laceration. Adrenal/Urinary Tract: No hemorrhage or parenchymal lacerations identified. Tiny cyst noted in upper pole of right kidney. No evidence of mass or hydronephrosis. Unremarkable unopacified urinary bladder. Stomach/Bowel: Unopacified bowel loops are unremarkable in appearance. No evidence of hemoperitoneum. Diverticulosis is seen mainly involving the sigmoid colon, however there is no evidence of diverticulitis. Vascular/Lymphatic: No evidence of abdominal aortic injury or retroperitoneal hemorrhage. No pathologically enlarged lymph nodes identified. Aortic atherosclerosis incidentally noted. Reproductive:  No mass or other significant abnormality identified. Other:  None. Musculoskeletal: No acute fractures or suspicious bone lesions identified. IMPRESSION: 1. No evidence of traumatic injury or other acute findings within the chest, abdomen, or pelvis. 2. 4 mm indeterminate left upper lobe pulmonary nodule. No follow-up needed if patient is low-risk. Non-contrast chest CT can be considered in 12 months if  patient is high-risk. This recommendation follows the consensus statement: Guidelines for Management of Incidental Pulmonary Nodules Detected on CT Images: From the Fleischner Society 2017; Radiology 2017; 284:228-243. 3. Hepatic steatosis and cholelithiasis. No radiographic evidence of cholecystitis. 4. Colonic diverticulosis, without radiographic evidence of diverticulitis. Electronically Signed   By: Danae OrleansJohn A Stahl M.D.   On: 08/16/2019 18:15   CT Cervical Spine Wo Contrast  Result Date: 08/24/2019 CLINICAL DATA:  Multiple trauma secondary to a fall on 08/16/2019. Bruising to the face and neck. EXAM: CT HEAD WITHOUT CONTRAST CT MAXILLOFACIAL WITHOUT CONTRAST CT CERVICAL SPINE WITHOUT CONTRAST TECHNIQUE: Multidetector CT imaging of the head, cervical spine, and maxillofacial  structures were performed using the standard protocol without intravenous contrast. Multiplanar CT image reconstructions of the cervical spine and maxillofacial structures were also generated. COMPARISON:  CT scans dated 08/16/2019 FINDINGS: CT HEAD FINDINGS Brain: No evidence of acute infarction, hemorrhage, hydrocephalus, extra-axial collection or mass lesion/mass effect. There is moderate cerebral cortical atrophy with asymmetric dilatation of the lateral ventricles. The atrophy is most severe in the temporal lobes. No significant change since the prior study. Diffuse periventricular white matter lucency consistent with chronic small vessel ischemic disease. Vascular: No hyperdense vessel or unexpected calcification. Skull: The skull is intact. There are facial bone fractures described below. Other: None CT MAXILLOFACIAL FINDINGS Osseous: Again noted are fractures of the anterior and lateral walls of the left maxillary sinus and of the floor of the left orbit. The previously described fracture of the anterior wall of the right maxillary sinus is not apparent on the current study. No other acute abnormality. Severe arthritic changes of the left mandibular condyle, chronic. Orbits: No acute abnormality of the orbits. Again noted is the slightly depressed fracture of the floor of the left orbit without entrapment of the inferior rectus muscle. Sinuses: There is persistent opacification of the left maxillary sinus by hemorrhage. The previously noted hemorrhage into the right maxillary sinus has almost completely resolved. The ethmoid air cells and frontal sinus and mastoid air cells and middle ear cavities are clear. Soft tissues: Interval marked decrease in the soft tissue contusion of the left cheek seen on the prior study. No new soft tissue abnormalities. CT CERVICAL SPINE FINDINGS Alignment: Normal. Skull base and vertebrae: No acute fracture. No primary bone lesion or focal pathologic process. Soft tissues and  spinal canal: No prevertebral fluid or swelling. No visible canal hematoma. Disc levels: There is no significant disc bulging or disc protrusion in the cervical spine. There is moderate right facet arthritis at C3-4 moderate left facet arthritis at C4-5. No significant foraminal stenosis. Upper chest: Negative. Other: None IMPRESSION: 1. No acute intracranial abnormality. Atrophy with chronic small vessel ischemic disease. 2. No significant abnormality of the cervical spine. 3. Fractures of the anterior and lateral walls of the left maxillary sinus and floor of the left orbit, unchanged. 4. Decreased soft tissue swelling of the left cheek. Decreased hemorrhage into the right axillary sinus. Electronically Signed   By: Francene BoyersJames  Maxwell M.D.   On: 08/24/2019 12:51   CT Cervical Spine Wo Contrast  Result Date: 08/16/2019 CLINICAL DATA:  Fall from standing height into door frame. EXAM: CT HEAD WITHOUT CONTRAST CT MAXILLOFACIAL WITHOUT CONTRAST CT CERVICAL SPINE WITHOUT CONTRAST TECHNIQUE: Multidetector CT imaging of the head, cervical spine, and maxillofacial structures were performed using the standard protocol without intravenous contrast. Multiplanar CT image reconstructions of the cervical spine and maxillofacial structures were also generated. COMPARISON:  None. FINDINGS: Moderate motion artifact  throughout the exam as multiple images had to be repeated. CT HEAD FINDINGS Brain: Ventricles, cisterns and CSF spaces are mildly prominent compatible with age related atrophy. There is chronic ischemic microvascular disease. There is no mass, mass effect, shift of midline structures or acute hemorrhage. Evidence of acute infarction Vascular: No hyperdense vessel or unexpected calcification. Skull: No evidence of skull fracture. Displaced fractures of the anterolateral walls of the left maxillary sinus and left orbital floor. Other: Left periorbital soft tissue swelling. CT MAXILLOFACIAL FINDINGS Osseous: Examination  demonstrates displaced comminuted fractures of the anterior wall left maxillary sinus/left orbital floor. No entrapment of left intraorbital contents. There is minimally displaced comminuted fracture of the lateral wall of the left maxillary sinus. Subtle nondisplaced fracture involving the anterior wall of the right maxillary sinus. Subtle fracture along the posterior aspect of the lateral wall of the left orbit. Moderate degenerative changes of the temporomandibular joints bilaterally. Orbits: Globes are normal and symmetric. Retrobulbar spaces are normal. Moderate left periorbital soft tissue swelling. Depressed slightly comminuted fracture of the left orbital floor as described above. No entrapment of orbital contents. Subtle fracture of the posterior aspect of the lateral wall of the left orbit. Sinuses: Moderate opacification throughout the maxillary sinuses likely hemorrhagic debris. Mastoid air cells are clear. Deviation of the nasal septum to the right. Left maxillary sinus wall fractures as described. Soft tissues: Moderate soft tissue swelling over the left mid to lower face and periorbital region. CT CERVICAL SPINE FINDINGS Alignment: No posttraumatic subluxation. Skull base and vertebrae: Atlantoaxial articulation is unremarkable. There is uncovertebral joint spurring and facet arthropathy. No definite acute fracture. Mild to moderate spondylosis of the cervical spine. Vertebral body heights are maintained. Soft tissues and spinal canal: No prevertebral fluid or swelling. No visible canal hematoma. Disc levels: Mild disc space narrowing is present at the C6-7 level. Upper chest: No acute findings. Other: None. IMPRESSION: 1.  No acute brain injury. 2. Chronic ischemic microvascular disease and age related atrophic change. 3. Multiple acute facial bone fractures as described above involving the lateral wall and inferior floor of the left orbit, anterior and lateral walls of the left maxillary sinus and  anterior wall of the right maxillary sinus. Associated soft tissue swelling over the left face and periorbital region. Hemorrhagic debris within the maxillary sinuses. 4.  No acute cervical spine injury. 5. Mild to moderate spondylosis of the cervical spine with disc disease at the C6-7 level. Electronically Signed   By: Elberta Fortis M.D.   On: 08/16/2019 17:11   MR BRAIN WO CONTRAST  Result Date: 08/25/2019 CLINICAL DATA:  Encephalopathy. Facial trauma secondary to recent fall. The examination had to be discontinued prior to completion due to extreme patient motion despite attempts at sedation. EXAM: MRI HEAD WITHOUT CONTRAST TECHNIQUE: Multiplanar, multiecho pulse sequences of the brain and surrounding structures were obtained without intravenous contrast. COMPARISON:  CT head and face without contrast 08/24/2019 08/16/2019 FINDINGS: Brain: Only sagittal T1 weighted images diffusion-weighted images are obtained. These are significantly degraded by patient motion. No acute infarct is evident. No definite mass lesion or hematoma is present. Moderate atrophy is noted. IMPRESSION: Severe motion degraded study without evidence for acute infarct, hemorrhage, or mass. Detailed evaluation for causes of encephalopathy is limited. Electronically Signed   By: Marin Roberts M.D.   On: 08/25/2019 13:35   CT ABDOMEN PELVIS W CONTRAST  Result Date: 08/16/2019 CLINICAL DATA:  Fall. Chest and abdominal trauma and pain. Initial encounter. EXAM: CT CHEST, ABDOMEN,  AND PELVIS WITH CONTRAST TECHNIQUE: Multidetector CT imaging of the chest, abdomen and pelvis was performed following the standard protocol during bolus administration of intravenous contrast. CONTRAST:  80mL OMNIPAQUE IOHEXOL 300 MG/ML  SOLN COMPARISON:  None. FINDINGS: CT CHEST FINDINGS Cardiovascular: No evidence of thoracic aortic injury or mediastinal hematoma. No pericardial effusion. Aortic and coronary artery atherosclerosis incidentally noted.  Mediastinum/Nodes: No evidence of pneumomediastinum. No masses or pathologically enlarged lymph nodes identified. Lungs/Pleura: No evidence of pulmonary contusion. Mild atelectasis seen in the dependent portions of the lower lobes. No evidence of pneumothorax or hemothorax. A 4 mm pulmonary nodule is seen in the posterior left upper lobe on image 28/series 4. Musculoskeletal: No acute fractures or suspicious bone lesions identified. CT ABDOMEN PELVIS FINDINGS Hepatobiliary: No hepatic laceration or mass identified. Mild-to-moderate diffuse hepatic steatosis is seen. Gallstones are seen, however there is no evidence of cholecystitis or biliary dilatation. Pancreas: No parenchymal laceration, mass, or inflammatory changes identified. Spleen: No evidence of splenic laceration. Adrenal/Urinary Tract: No hemorrhage or parenchymal lacerations identified. Tiny cyst noted in upper pole of right kidney. No evidence of mass or hydronephrosis. Unremarkable unopacified urinary bladder. Stomach/Bowel: Unopacified bowel loops are unremarkable in appearance. No evidence of hemoperitoneum. Diverticulosis is seen mainly involving the sigmoid colon, however there is no evidence of diverticulitis. Vascular/Lymphatic: No evidence of abdominal aortic injury or retroperitoneal hemorrhage. No pathologically enlarged lymph nodes identified. Aortic atherosclerosis incidentally noted. Reproductive:  No mass or other significant abnormality identified. Other:  None. Musculoskeletal: No acute fractures or suspicious bone lesions identified. IMPRESSION: 1. No evidence of traumatic injury or other acute findings within the chest, abdomen, or pelvis. 2. 4 mm indeterminate left upper lobe pulmonary nodule. No follow-up needed if patient is low-risk. Non-contrast chest CT can be considered in 12 months if patient is high-risk. This recommendation follows the consensus statement: Guidelines for Management of Incidental Pulmonary Nodules Detected on  CT Images: From the Fleischner Society 2017; Radiology 2017; 284:228-243. 3. Hepatic steatosis and cholelithiasis. No radiographic evidence of cholecystitis. 4. Colonic diverticulosis, without radiographic evidence of diverticulitis. Electronically Signed   By: Danae Orleans M.D.   On: 08/16/2019 18:15   DG Shoulder Left  Result Date: 08/16/2019 CLINICAL DATA:  Pain EXAM: LEFT SHOULDER - 2+ VIEW COMPARISON:  None. FINDINGS: There is no acute displaced fracture. No dislocation. Moderate degenerative changes are noted of the left glenohumeral joint. There are old healed left-sided rib fractures. IMPRESSION: Negative. Electronically Signed   By: Katherine Mantle M.D.   On: 08/16/2019 16:17   DG Humerus Left  Result Date: 08/16/2019 CLINICAL DATA:  Pain status post fall EXAM: LEFT HUMERUS - 2+ VIEW COMPARISON:  None. FINDINGS: There is no evidence of fracture or other focal bone lesions. Soft tissues are unremarkable. IMPRESSION: Negative. Electronically Signed   By: Katherine Mantle M.D.   On: 08/16/2019 16:22   CT Maxillofacial Wo Contrast  Result Date: 08/24/2019 CLINICAL DATA:  Multiple trauma secondary to a fall on 08/16/2019. Bruising to the face and neck. EXAM: CT HEAD WITHOUT CONTRAST CT MAXILLOFACIAL WITHOUT CONTRAST CT CERVICAL SPINE WITHOUT CONTRAST TECHNIQUE: Multidetector CT imaging of the head, cervical spine, and maxillofacial structures were performed using the standard protocol without intravenous contrast. Multiplanar CT image reconstructions of the cervical spine and maxillofacial structures were also generated. COMPARISON:  CT scans dated 08/16/2019 FINDINGS: CT HEAD FINDINGS Brain: No evidence of acute infarction, hemorrhage, hydrocephalus, extra-axial collection or mass lesion/mass effect. There is moderate cerebral cortical atrophy with asymmetric dilatation of  the lateral ventricles. The atrophy is most severe in the temporal lobes. No significant change since the prior study.  Diffuse periventricular white matter lucency consistent with chronic small vessel ischemic disease. Vascular: No hyperdense vessel or unexpected calcification. Skull: The skull is intact. There are facial bone fractures described below. Other: None CT MAXILLOFACIAL FINDINGS Osseous: Again noted are fractures of the anterior and lateral walls of the left maxillary sinus and of the floor of the left orbit. The previously described fracture of the anterior wall of the right maxillary sinus is not apparent on the current study. No other acute abnormality. Severe arthritic changes of the left mandibular condyle, chronic. Orbits: No acute abnormality of the orbits. Again noted is the slightly depressed fracture of the floor of the left orbit without entrapment of the inferior rectus muscle. Sinuses: There is persistent opacification of the left maxillary sinus by hemorrhage. The previously noted hemorrhage into the right maxillary sinus has almost completely resolved. The ethmoid air cells and frontal sinus and mastoid air cells and middle ear cavities are clear. Soft tissues: Interval marked decrease in the soft tissue contusion of the left cheek seen on the prior study. No new soft tissue abnormalities. CT CERVICAL SPINE FINDINGS Alignment: Normal. Skull base and vertebrae: No acute fracture. No primary bone lesion or focal pathologic process. Soft tissues and spinal canal: No prevertebral fluid or swelling. No visible canal hematoma. Disc levels: There is no significant disc bulging or disc protrusion in the cervical spine. There is moderate right facet arthritis at C3-4 moderate left facet arthritis at C4-5. No significant foraminal stenosis. Upper chest: Negative. Other: None IMPRESSION: 1. No acute intracranial abnormality. Atrophy with chronic small vessel ischemic disease. 2. No significant abnormality of the cervical spine. 3. Fractures of the anterior and lateral walls of the left maxillary sinus and floor of  the left orbit, unchanged. 4. Decreased soft tissue swelling of the left cheek. Decreased hemorrhage into the right axillary sinus. Electronically Signed   By: Lorriane Shire M.D.   On: 08/24/2019 12:51   CT Maxillofacial Wo Contrast  Result Date: 08/16/2019 CLINICAL DATA:  Fall from standing height into door frame. EXAM: CT HEAD WITHOUT CONTRAST CT MAXILLOFACIAL WITHOUT CONTRAST CT CERVICAL SPINE WITHOUT CONTRAST TECHNIQUE: Multidetector CT imaging of the head, cervical spine, and maxillofacial structures were performed using the standard protocol without intravenous contrast. Multiplanar CT image reconstructions of the cervical spine and maxillofacial structures were also generated. COMPARISON:  None. FINDINGS: Moderate motion artifact throughout the exam as multiple images had to be repeated. CT HEAD FINDINGS Brain: Ventricles, cisterns and CSF spaces are mildly prominent compatible with age related atrophy. There is chronic ischemic microvascular disease. There is no mass, mass effect, shift of midline structures or acute hemorrhage. Evidence of acute infarction Vascular: No hyperdense vessel or unexpected calcification. Skull: No evidence of skull fracture. Displaced fractures of the anterolateral walls of the left maxillary sinus and left orbital floor. Other: Left periorbital soft tissue swelling. CT MAXILLOFACIAL FINDINGS Osseous: Examination demonstrates displaced comminuted fractures of the anterior wall left maxillary sinus/left orbital floor. No entrapment of left intraorbital contents. There is minimally displaced comminuted fracture of the lateral wall of the left maxillary sinus. Subtle nondisplaced fracture involving the anterior wall of the right maxillary sinus. Subtle fracture along the posterior aspect of the lateral wall of the left orbit. Moderate degenerative changes of the temporomandibular joints bilaterally. Orbits: Globes are normal and symmetric. Retrobulbar spaces are normal.  Moderate left periorbital soft  tissue swelling. Depressed slightly comminuted fracture of the left orbital floor as described above. No entrapment of orbital contents. Subtle fracture of the posterior aspect of the lateral wall of the left orbit. Sinuses: Moderate opacification throughout the maxillary sinuses likely hemorrhagic debris. Mastoid air cells are clear. Deviation of the nasal septum to the right. Left maxillary sinus wall fractures as described. Soft tissues: Moderate soft tissue swelling over the left mid to lower face and periorbital region. CT CERVICAL SPINE FINDINGS Alignment: No posttraumatic subluxation. Skull base and vertebrae: Atlantoaxial articulation is unremarkable. There is uncovertebral joint spurring and facet arthropathy. No definite acute fracture. Mild to moderate spondylosis of the cervical spine. Vertebral body heights are maintained. Soft tissues and spinal canal: No prevertebral fluid or swelling. No visible canal hematoma. Disc levels: Mild disc space narrowing is present at the C6-7 level. Upper chest: No acute findings. Other: None. IMPRESSION: 1.  No acute brain injury. 2. Chronic ischemic microvascular disease and age related atrophic change. 3. Multiple acute facial bone fractures as described above involving the lateral wall and inferior floor of the left orbit, anterior and lateral walls of the left maxillary sinus and anterior wall of the right maxillary sinus. Associated soft tissue swelling over the left face and periorbital region. Hemorrhagic debris within the maxillary sinuses. 4.  No acute cervical spine injury. 5. Mild to moderate spondylosis of the cervical spine with disc disease at the C6-7 level. Electronically Signed   By: Elberta Fortis M.D.   On: 08/16/2019 17:11     Microbiology: Recent Results (from the past 240 hour(s))  Urine Culture     Status: Abnormal   Collection Time: 08/24/19 11:18 AM   Specimen: Urine, Clean Catch  Result Value Ref Range  Status   Specimen Description   Final    URINE, CLEAN CATCH Performed at [facility], [address] [place], [place] [address]    Special Requests   Final    NONE Performed at [facility], [address], [place], [place] [address]    Culture (A)  Final    <10,000 COLONIES/mL INSIGNIFICANT GROWTH Performed at [facility], [address] [place], [place] [address]    Report Status 08/26/2019 FINAL  Final  Respiratory Panel by RT PCR (Flu A&B, Covid) - Nasopharyngeal Swab     Status: None   Collection Time: 08/24/19 11:20 AM   Specimen: Nasopharyngeal Swab  Result Value Ref Range Status   SARS Coronavirus 2 by RT PCR NEGATIVE NEGATIVE Final    Comment: (NOTE) SARS-CoV-2 target nucleic acids are NOT DETECTED. The SARS-CoV-2 RNA is generally detectable in upper respiratoy specimens during the acute phase of infection. The lowest concentration of SARS-CoV-2 viral copies this assay can detect is 131 copies/mL. A negative result does not preclude SARS-Cov-2 infection and should not be used as the sole basis for treatment or other patient management decisions. A negative result may occur with  improper specimen collection/handling, submission of specimen other than nasopharyngeal swab, presence of viral mutation(s) within the areas targeted by this assay, and inadequate number of viral copies (<131 copies/mL). A negative result must be combined with clinical observations, patient history, and epidemiological information. The expected result is Negative. Fact Sheet for Patients:  https://www.moore.com/ Fact Sheet for Healthcare Providers:  https://www.young.biz/ This test is not yet ap proved or cleared by the [place] FDA and  has been authorized for detection and/or diagnosis of SARS-CoV-2 by FDA under an Emergency Use Authorization (EUA). This  EUA will remain  in effect (meaning this test can be used) for the duration of  the COVID-19 declaration under Section 564(b)(1) of the Act, 21 U.S.C. section 360bbb-3(b)(1), unless the authorization is terminated or revoked sooner.    Influenza A by PCR NEGATIVE NEGATIVE Final   Influenza B by PCR NEGATIVE NEGATIVE Final    Comment: (NOTE) The Xpert Xpress SARS-CoV-2/FLU/RSV assay is intended as an aid in  the diagnosis of influenza from Nasopharyngeal swab specimens and  should not be used as a sole basis for treatment. Nasal washings and  aspirates are unacceptable for Xpert Xpress SARS-CoV-2/FLU/RSV  testing. Fact Sheet for Patients: https://www.moore.com/ Fact Sheet for Healthcare Providers: https://www.young.biz/ This test is not yet approved or cleared by the [place] FDA and  has been authorized for detection and/or diagnosis of SARS-CoV-2 by  FDA under an Emergency Use Authorization (EUA). This EUA will remain  in effect (meaning this test can be used) for the duration of the  Covid-19 declaration under Section 564(b)(1) of the Act, 21  U.S.C. section 360bbb-3(b)(1), unless the authorization is  terminated or revoked. Performed at [facility], [address] [place], [place] [address]      Labs: Basic Metabolic Panel: Recent Labs  Lab 08/24/19 1117 08/24/19 1117 08/25/19 0505 08/27/19 0502  NA 137  --  140 138  K 4.6   < > 4.0 3.5  CL 103  --  110 111  CO2 18*  --  21* 21*  GLUCOSE 336*  --  177* 101*  BUN 56*  --  56* 29*  CREATININE 1.48*  --  1.22* 0.93  CALCIUM 9.6  --  9.2 8.6*  MG  --   --   --  1.7   < > = values in this interval not displayed.   Liver Function Tests: Recent Labs  Lab 08/24/19 1117 08/25/19 0505  AST 46* 42*  ALT 42 33  ALKPHOS 62 52  BILITOT 0.9 1.0  PROT 7.8 6.7  ALBUMIN 4.3 3.8   Recent Labs  Lab 08/24/19 1117  LIPASE 33   Recent Labs  Lab 08/24/19 1120  AMMONIA 17   CBC: Recent Labs  Lab 08/24/19 1117 08/25/19 0505  WBC 7.0 6.3  NEUTROABS  6.5  --   HGB 13.6 12.0  HCT 42.8 37.5  MCV 91.6 92.6  PLT 186 163   Cardiac Enzymes: No results for input(s): CKTOTAL, CKMB, CKMBINDEX, TROPONINI in the last 168 hours. BNP: Invalid input(s): POCBNP CBG: Recent Labs  Lab 08/26/19 1135 08/26/19 1708 08/26/19 2323 08/27/19 0609 08/27/19 0740  GLUCAP 129* 109* 104* 87 100*    Time coordinating discharge:  36 minutes  Signed:  Catarina Hartshorn, DO [facility] Pager: [phone] 08/27/2019, 9:53 AM

## 2019-08-27 NOTE — Care Management (Signed)
SARS Coronavirus 2 by RT PCR Respiratory Panel by RT PCR (Flu A&B, Covid) - Nasopharyngeal Swab Collected: 08/24/19 1120  Result status: Final  Resulting lab: [facility]  Reference range: NEGATIVE  Value: NEGATIVE  Comment: (NOTE)  SARS-CoV-2 target nucleic acids are NOT DETECTED.  The SARS-CoV-2 RNA is generally detectable in upper respiratoy  specimens during the acute phase of infection. The lowest  concentration of SARS-CoV-2 viral copies this assay can detect is  131 copies/mL. A negative result does not preclude SARS-Cov-2  infection and should not be used as the sole basis for treatment or  other patient management decisions. A negative result may occur with  improper specimen collection/handling, submission of specimen other  than nasopharyngeal swab, presence of viral mutation(s) within the  areas targeted by this assay, and inadequate number of viral copies  (<131 copies/mL). A negative result must be combined with clinical  observations, patient history, and epidemiological information. The  expected result is Negative.  Fact Sheet for Patients:  https://www.moore.com/  Fact Sheet for Healthcare Providers:  https://www.young.biz/  This test is not yet approved or cleared by the [place] FDA and  has been authorized for detection and/or diagnosis of SARS-CoV-2 by  FDA under an Emergency Use Authorization (EUA). This EUA will remain  in effect (meaning this test can be used) for the duration of the  COVID-19 declaration under Section 564(b)(1) of the Act, 21 U.S.C.  section 360bbb-3(b)(1), unless the authorization is terminated or  revoked sooner.   *Additional information available - comment  Respiratory Panel by RT PCR (Flu A&B, Covid) - Nasopharyngeal Swab (Order [id number]) Respiratory Panel by RT PCR (Flu A&B, Covid) - Nasopharyngeal Swab Order: [id number] Status:  Final result Visible to patient:  No  (inaccessible in MyChart) Next appt:  None Specimen Information: Nasopharyngeal Swab      Ref Range & Units 3 d ago  SARS Coronavirus 2 by RT PCR NEGATIVE NEGATIVE   Comment: (NOTE)  SARS-CoV-2 target nucleic acids are NOT DETECTED.  The SARS-CoV-2 RNA is generally detectable in upper respiratoy  specimens during the acute phase of infection. The lowest  concentration of SARS-CoV-2 viral copies this assay can detect is  131 copies/mL. A negative result does not preclude SARS-Cov-2  infection and should not be used as the sole basis for treatment or  other patient management decisions. A negative result may occur with  improper specimen collection/handling, submission of specimen other  than nasopharyngeal swab, presence of viral mutation(s) within the  areas targeted by this assay, and inadequate number of viral copies  (<131 copies/mL). A negative result must be combined with clinical  observations, patient history, and epidemiological information. The  expected result is Negative.  Fact Sheet for Patients:  https://www.moore.com/  Fact Sheet for Healthcare Providers:  https://www.young.biz/  This test is not yet approved or cleared by the [place] FDA and  has been authorized for detection and/or diagnosis of SARS-CoV-2 by  FDA under an Emergency Use Authorization (EUA). This EUA will remain  in effect (meaning this test can be used) for the duration of the  COVID-19 declaration under Section 564(b)(1) of the Act, 21 U.S.C.  section 360bbb-3(b)(1), unless the authorization is terminated or  revoked sooner.   Influenza A by PCR NEGATIVE NEGATIVE   Influenza B by PCR NEGATIVE NEGATIVE   Comment: (NOTE)  The Xpert Xpress SARS-CoV-2/FLU/RSV assay is intended as an aid in  the diagnosis of influenza from Nasopharyngeal swab specimens and  should not be used as a sole basis for treatment. Nasal washings and  aspirates are unacceptable for  Xpert Xpress SARS-CoV-2/FLU/RSV  testing.  Fact Sheet for Patients:  PinkCheek.be  Fact Sheet for Healthcare Providers:  GravelBags.it  This test is not yet approved or cleared by the [place] FDA and  has been authorized for detection and/or diagnosis of SARS-CoV-2 by  FDA under an Emergency Use Authorization (EUA). This EUA will remain  in effect (meaning this test can be used) for the duration of the  Covid-19 declaration under Section 564(b)(1) of the Act, 21  U.S.C. section 360bbb-3(b)(1), unless the authorization is  terminated or revoked.  Performed at [facility], [address] [place], [place] [address]   Resulting Agency  [facility]      Specimen Collected: 08/24/19 11:20 Last Resulted: 08/24/19 13:10

## 2019-08-27 NOTE — NC FL2 (Addendum)
[place] MEDICAID FL2 LEVEL OF CARE SCREENING TOOL     IDENTIFICATION  Patient Name: Christina Ball Birthdate: [birth year] Sex: female Admission Date (Current Location): 08/24/2019  [facility] and [place] Number:  [employer] and Address:  [facility],  [address], [place] [address]      Provider Number: [id number]  Attending Physician Name and Address:  Catarina Hartshorn, MD  Relative Name and Phone Number:       Current Level of Care: Hospital Recommended Level of Care:   Prior Approval Number:    Date Approved/Denied:   PASRR Number:    Discharge Plan: Domiciliary (Rest home)    Current Diagnoses: Patient Active Problem List   Diagnosis Date Noted  . Acute renal failure superimposed on stage 3b chronic kidney disease (HCC) 08/27/2019  . Acute urinary retention 08/25/2019  . Dementia without behavioral disturbance (HCC) 08/24/2019  . Type 2 diabetes mellitus without complication (HCC) 08/24/2019  . Hyperlipidemia 08/24/2019  . Essential hypertension 08/24/2019  . Encephalopathy acute 08/24/2019    Orientation RESPIRATION BLADDER Height & Weight     Self  Normal Continent Weight: 139 lb 5.3 oz (63.2 kg) Height:  5\' 5"  (165.1 cm)  BEHAVIORAL SYMPTOMS/MOOD NEUROLOGICAL BOWEL NUTRITION STATUS      Continent Diet  AMBULATORY STATUS COMMUNICATION OF NEEDS Skin   Limited Assist Verbally Normal                       Personal Care Assistance Level of Assistance  Bathing, Feeding, Dressing Bathing Assistance: Limited assistance Feeding assistance: Independent Dressing Assistance: Limited assistance     Functional Limitations Info  Sight, Hearing, Speech Sight Info: Adequate Hearing Info: Adequate Speech Info: Adequate    SPECIAL CARE FACTORS FREQUENCY                       Contractures Contractures Info: Not present    Additional Factors Info  Code Status, Allergies, Psychotropic Code Status Info: DNR Allergies Info:  NKA Psychotropic Info: Zoloft         Current Medications (08/27/2019):  This is the current hospital active medication list Current Facility-Administered Medications  Medication Dose Route Frequency Provider Last Rate Last Admin  . 0.45 % sodium chloride infusion   Intravenous Continuous Elgergawy, 10/25/2019, MD 50 mL/hr at 08/26/19 1308 New Bag at 08/26/19 1308  . acetaminophen (TYLENOL) tablet 650 mg  650 mg Oral Q6H PRN Elgergawy, 10/24/19, MD       Or  . acetaminophen (TYLENOL) suppository 650 mg  650 mg Rectal Q6H PRN Elgergawy, Leana Roe, MD      . Chlorhexidine Gluconate Cloth 2 % PADS 6 each  6 each Topical Daily Johnson, Clanford L, MD   6 each at 08/26/19 1000  . heparin injection 5,000 Units  5,000 Units Subcutaneous Q8H Elgergawy, 10/24/19, MD   5,000 Units at 08/27/19 [phone]  . magnesium sulfate IVPB 2 g 50 mL  2 g Intravenous Once Tat, David, MD      . ondansetron [facility]) tablet 4 mg  4 mg Oral Q6H PRN Elgergawy, [facility], MD       Or  . ondansetron (ZOFRAN) injection 4 mg  4 mg Intravenous Q6H PRN Elgergawy, Leana Roe, MD         Discharge Medications: Medication List    STOP taking these medications   baclofen 10 MG tablet Commonly known as: LIORESAL   cephALEXin 500  MG capsule Commonly known as: KEFLEX   losartan 25 MG tablet Commonly known as: COZAAR   meloxicam 15 MG tablet Commonly known as: MOBIC   metFORMIN 500 MG tablet Commonly known as: GLUCOPHAGE     TAKE these medications   acetaminophen 500 MG tablet Commonly known as: TYLENOL Take 500 mg by mouth every 6 (six) hours as needed for mild pain or moderate pain.   Melatonin 3 MG Tabs Take 3 mg by mouth at bedtime.   nystatin powder Commonly known as: MYCOSTATIN/NYSTOP Apply 1 application topically 2 (two) times daily. Applied under breasts for rash   sertraline 50 MG tablet Commonly known as: ZOLOFT Take 75 mg by mouth every morning.     Relevant Imaging Results:  Relevant  Lab Results:   Additional [employer], LCSW

## 2019-08-27 NOTE — Progress Notes (Signed)
Pt d/c home to [facility] per MD orders. D/C instructions printed and given to pt's transporter. Report faxed via CM. In w/c to [place].

## 2019-12-16 ENCOUNTER — Other Ambulatory Visit: Payer: Self-pay

## 2019-12-16 ENCOUNTER — Emergency Department (HOSPITAL_COMMUNITY)
Admission: EM | Admit: 2019-12-16 | Discharge: 2019-12-16 | Disposition: A | Discharge status: Home / Self Care | Payer: Medicare PPO | Attending: Emergency Medicine | Admitting: Emergency Medicine

## 2019-12-16 ENCOUNTER — Emergency Department (HOSPITAL_COMMUNITY): Payer: Medicare PPO

## 2019-12-16 ENCOUNTER — Encounter (HOSPITAL_COMMUNITY): Payer: Self-pay | Admitting: Emergency Medicine

## 2019-12-16 DIAGNOSIS — S0990XA Unspecified injury of head, initial encounter: Secondary | ICD-10-CM | POA: Diagnosis not present

## 2019-12-16 DIAGNOSIS — F0390 Unspecified dementia without behavioral disturbance: Secondary | ICD-10-CM | POA: Insufficient documentation

## 2019-12-16 DIAGNOSIS — W19XXXA Unspecified fall, initial encounter: Secondary | ICD-10-CM | POA: Diagnosis not present

## 2019-12-16 DIAGNOSIS — I10 Essential (primary) hypertension: Secondary | ICD-10-CM | POA: Diagnosis not present

## 2019-12-16 DIAGNOSIS — Y9389 Activity, other specified: Secondary | ICD-10-CM | POA: Insufficient documentation

## 2019-12-16 DIAGNOSIS — Y998 Other external cause status: Secondary | ICD-10-CM | POA: Diagnosis not present

## 2019-12-16 DIAGNOSIS — E119 Type 2 diabetes mellitus without complications: Secondary | ICD-10-CM | POA: Diagnosis not present

## 2019-12-16 DIAGNOSIS — Y92129 Unspecified place in nursing home as the place of occurrence of the external cause: Secondary | ICD-10-CM | POA: Diagnosis not present

## 2019-12-16 NOTE — ED Triage Notes (Addendum)
[facility] - pt from [place]. Pt was found on the floor by staff, unknown if LOC or what she hit her head on. Pt has hematoma above right eye.

## 2019-12-16 NOTE — ED Notes (Signed)
Christina Ball to pick up pt from ER. She will be here about 11am.

## 2019-12-16 NOTE — ED Notes (Signed)
Pt is confused and unaware of why her right eye feels swollen.

## 2019-12-16 NOTE — ED Notes (Signed)
[facility] aware of no new orders and Lupita Leash will be bringing pt back.

## 2019-12-16 NOTE — Discharge Instructions (Addendum)
Tylenol for pain. Follow-up if any problems.

## 2019-12-16 NOTE — ED Provider Notes (Signed)
[facility] EMERGENCY DEPARTMENT Provider Note   CSN: [id number] Arrival date & time: 12/16/19  9038     History Chief Complaint  Patient presents with  . Fall    Christina Ball is a 86 y.o. female.  Patient has a history of dementia. She found on the floor today. She has bruising to her face  The history is provided by the patient and the nursing home. No language interpreter was used.  Fall This is a new problem. The current episode started 12 to 24 hours ago. The problem occurs rarely. The problem has been resolved. Pertinent negatives include no chest pain. Exacerbated by: Palpitation of face. Nothing relieves the symptoms. She has tried nothing for the symptoms. The treatment provided no relief.       Past Medical History:  Diagnosis Date  . Angina pectoris (HCC)   . Diabetes mellitus without complication (HCC)   . Hyperlipidemia   . Hypertension   . Osteoporosis     Patient Active Problem List   Diagnosis Date Noted  . Acute renal failure superimposed on stage 3b chronic kidney disease (HCC) 08/27/2019  . Acute urinary retention 08/25/2019  . Dementia without behavioral disturbance (HCC) 08/24/2019  . Type 2 diabetes mellitus without complication (HCC) 08/24/2019  . Hyperlipidemia 08/24/2019  . Essential hypertension 08/24/2019  . Encephalopathy acute 08/24/2019    History reviewed. No pertinent surgical history.   OB History   No obstetric history on file.     History reviewed. No pertinent family history.  Social History   Tobacco Use  . Smoking status: Unknown If Ever Smoked  Substance Use Topics  . Alcohol use: Not Currently    Comment: unknown  . Drug use: Not Currently    Comment: unknown    Home Medications Prior to Admission medications   Medication Sig Start Date End Date Taking? Authorizing Provider  acetaminophen (TYLENOL) 500 MG tablet Take 500 mg by mouth every 6 (six) hours as needed for mild pain or moderate pain.    [provider]  Melatonin 3 MG TABS Take 3 mg by mouth at bedtime.    [provider]  nystatin (MYCOSTATIN/NYSTOP) powder Apply 1 application topically 2 (two) times daily. Applied under breasts for rash    [provider]  sertraline (ZOLOFT) 50 MG tablet Take 75 mg by mouth every morning.     [provider]    Allergies    Patient has no known allergies.  Review of Systems   Review of Systems  Unable to perform ROS: Dementia  Cardiovascular: Negative for chest pain.    Physical Exam Updated Vital Signs BP (!) 157/83   Pulse 82   Temp 97.9 F (36.6 C) (Axillary)   Resp 18   Ht 5\' 5"  (1.651 m)   Wt 63.2 kg   SpO2 93%   BMI 23.19 kg/m   Physical Exam Vitals and nursing note reviewed.  Constitutional:      Appearance: She is well-developed.  HENT:     Head: Normocephalic.     Comments: Bruising and swelling around her right eye    Nose: Nose normal.  Eyes:     General: No scleral icterus.    Conjunctiva/sclera: Conjunctivae normal.  Neck:     Thyroid: No thyromegaly.  Cardiovascular:     Rate and Rhythm: Normal rate and regular rhythm.     Heart sounds: No murmur. No friction rub. No gallop.   Pulmonary:     Breath  sounds: No stridor. No wheezing or rales.  Chest:     Chest wall: No tenderness.  Abdominal:     General: There is no distension.     Tenderness: There is no abdominal tenderness. There is no rebound.  Musculoskeletal:        General: Normal range of motion.     Cervical back: Neck supple.  Lymphadenopathy:     Cervical: No cervical adenopathy.  Skin:    Findings: No erythema or rash.  Neurological:     Mental Status: She is alert.     Motor: No abnormal muscle tone.     Coordination: Coordination normal.     Comments: Patient oriented to person only this is her normal  Psychiatric:     Comments: Patient smiling and not agitated     ED Results / Procedures / Treatments   Labs (all labs ordered are listed, but  only abnormal results are displayed) Labs Reviewed - No data to display  EKG None  Radiology CT Head Wo Contrast  Result Date: 12/16/2019 CLINICAL DATA:  Found on floor with facial hematoma. Initial encounter. EXAM: CT HEAD WITHOUT CONTRAST CT MAXILLOFACIAL WITHOUT CONTRAST CT CERVICAL SPINE WITHOUT CONTRAST TECHNIQUE: Multidetector CT imaging of the head, cervical spine, and maxillofacial structures were performed using the standard protocol without intravenous contrast. Multiplanar CT image reconstructions of the cervical spine and maxillofacial structures were also generated. COMPARISON:  08/24/2019 FINDINGS: CT HEAD FINDINGS Brain: No evidence of acute infarction, hemorrhage, hydrocephalus, extra-axial collection or mass lesion/mass effect. Brain atrophy that is advanced in the temporal lobes, correlating with history of dementia. Chronic small vessel ischemia. Vascular: No hyperdense vessel or unexpected calcification. Skull: Facial findings described below.  No calvarial fracture. CT MAXILLOFACIAL FINDINGS Osseous: Healed left maxillary sinus and orbital floor fractures which were seen on prior. No acute fracture or mandibular dislocation. Orbits: No evidence of postseptal injury. Sinuses: No active sinusitis or hemosinus. Soft tissues: Large hematoma about the right eye. No opaque foreign body CT CERVICAL SPINE FINDINGS Alignment: Degenerative anterolisthesis at C4-5 and C5-6. No traumatic malalignment Skull base and vertebrae: No acute fracture. Incidental T2 body hemangioma. Soft tissues and spinal canal: No prevertebral fluid or swelling. No visible canal hematoma. Disc levels:  Degenerative facet spurring and C6-7 disc narrowing. Upper chest: No acute finding IMPRESSION: 1. No evidence of acute intracranial or cervical spine injury. 2. Right facial hematoma without acute fracture. Electronically Signed   By: Marnee Spring M.D.   On: 12/16/2019 08:05   CT Cervical Spine Wo Contrast  Result  Date: 12/16/2019 CLINICAL DATA:  Found on floor with facial hematoma. Initial encounter. EXAM: CT HEAD WITHOUT CONTRAST CT MAXILLOFACIAL WITHOUT CONTRAST CT CERVICAL SPINE WITHOUT CONTRAST TECHNIQUE: Multidetector CT imaging of the head, cervical spine, and maxillofacial structures were performed using the standard protocol without intravenous contrast. Multiplanar CT image reconstructions of the cervical spine and maxillofacial structures were also generated. COMPARISON:  08/24/2019 FINDINGS: CT HEAD FINDINGS Brain: No evidence of acute infarction, hemorrhage, hydrocephalus, extra-axial collection or mass lesion/mass effect. Brain atrophy that is advanced in the temporal lobes, correlating with history of dementia. Chronic small vessel ischemia. Vascular: No hyperdense vessel or unexpected calcification. Skull: Facial findings described below.  No calvarial fracture. CT MAXILLOFACIAL FINDINGS Osseous: Healed left maxillary sinus and orbital floor fractures which were seen on prior. No acute fracture or mandibular dislocation. Orbits: No evidence of postseptal injury. Sinuses: No active sinusitis or hemosinus. Soft tissues: Large hematoma about the right eye.  No opaque foreign body CT CERVICAL SPINE FINDINGS Alignment: Degenerative anterolisthesis at C4-5 and C5-6. No traumatic malalignment Skull base and vertebrae: No acute fracture. Incidental T2 body hemangioma. Soft tissues and spinal canal: No prevertebral fluid or swelling. No visible canal hematoma. Disc levels:  Degenerative facet spurring and C6-7 disc narrowing. Upper chest: No acute finding IMPRESSION: 1. No evidence of acute intracranial or cervical spine injury. 2. Right facial hematoma without acute fracture. Electronically Signed   By: Monte Fantasia M.D.   On: 12/16/2019 08:05   CT Maxillofacial Wo Contrast  Result Date: 12/16/2019 CLINICAL DATA:  Found on floor with facial hematoma. Initial encounter. EXAM: CT HEAD WITHOUT CONTRAST CT  MAXILLOFACIAL WITHOUT CONTRAST CT CERVICAL SPINE WITHOUT CONTRAST TECHNIQUE: Multidetector CT imaging of the head, cervical spine, and maxillofacial structures were performed using the standard protocol without intravenous contrast. Multiplanar CT image reconstructions of the cervical spine and maxillofacial structures were also generated. COMPARISON:  08/24/2019 FINDINGS: CT HEAD FINDINGS Brain: No evidence of acute infarction, hemorrhage, hydrocephalus, extra-axial collection or mass lesion/mass effect. Brain atrophy that is advanced in the temporal lobes, correlating with history of dementia. Chronic small vessel ischemia. Vascular: No hyperdense vessel or unexpected calcification. Skull: Facial findings described below.  No calvarial fracture. CT MAXILLOFACIAL FINDINGS Osseous: Healed left maxillary sinus and orbital floor fractures which were seen on prior. No acute fracture or mandibular dislocation. Orbits: No evidence of postseptal injury. Sinuses: No active sinusitis or hemosinus. Soft tissues: Large hematoma about the right eye. No opaque foreign body CT CERVICAL SPINE FINDINGS Alignment: Degenerative anterolisthesis at C4-5 and C5-6. No traumatic malalignment Skull base and vertebrae: No acute fracture. Incidental T2 body hemangioma. Soft tissues and spinal canal: No prevertebral fluid or swelling. No visible canal hematoma. Disc levels:  Degenerative facet spurring and C6-7 disc narrowing. Upper chest: No acute finding IMPRESSION: 1. No evidence of acute intracranial or cervical spine injury. 2. Right facial hematoma without acute fracture. Electronically Signed   By: Monte Fantasia M.D.   On: 12/16/2019 08:05    Procedures Procedures (including critical care time)  Medications Ordered in ED Medications - No data to display  ED Course  I have reviewed the triage vital signs and the nursing notes.  Pertinent labs & imaging results that were available during my care of the patient were  reviewed by me and considered in my medical decision making (see chart for details).    MDM Rules/Calculators/A&P                      Patient with a fall. Contusions to forehead and right eye. She has negative CT scan of head face and neck. She'll take Tylenol and follow-up with PCP.     This patient presents to the ED for concern of fall this involves an extensive number of treatment options, and is a complaint that carries with it a high risk of complications and morbidity.  The differential diagnosis includes cerebral hemorrhage facial fractures   Lab Tests:   Medicines ordered:   Imaging Studies ordered:   I ordered imaging studies which included CT head cervical spine and face and  I independently visualized and interpreted imaging which showed no fractures  Additional history obtained:   Additional history obtained from nursing home  Previous records obtained and reviewed   Consultations Obtained:     Reevaluation:  After the interventions stated above, I reevaluated the patient and found no change  Critical Interventions:  .  l Final Clinical Impression(s) / ED Diagnoses Final diagnoses:  Fall, initial encounter  Injury of head, initial encounter    Rx / DC Orders ED Discharge Orders    None       Bethann Berkshire, MD 12/16/19 [phone]

## 2020-01-04 ENCOUNTER — Emergency Department (HOSPITAL_COMMUNITY)
Admission: EM | Admit: 2020-01-04 | Discharge: 2020-01-04 | Disposition: A | Discharge status: Home / Self Care | Payer: Medicare PPO | Attending: Emergency Medicine | Admitting: Emergency Medicine

## 2020-01-04 ENCOUNTER — Emergency Department (HOSPITAL_COMMUNITY): Payer: Medicare PPO

## 2020-01-04 ENCOUNTER — Encounter (HOSPITAL_COMMUNITY): Payer: Self-pay

## 2020-01-04 ENCOUNTER — Other Ambulatory Visit: Payer: Self-pay

## 2020-01-04 DIAGNOSIS — S50312A Abrasion of left elbow, initial encounter: Secondary | ICD-10-CM | POA: Insufficient documentation

## 2020-01-04 DIAGNOSIS — Y999 Unspecified external cause status: Secondary | ICD-10-CM | POA: Insufficient documentation

## 2020-01-04 DIAGNOSIS — Y929 Unspecified place or not applicable: Secondary | ICD-10-CM | POA: Insufficient documentation

## 2020-01-04 DIAGNOSIS — W19XXXA Unspecified fall, initial encounter: Secondary | ICD-10-CM

## 2020-01-04 DIAGNOSIS — W1789XA Other fall from one level to another, initial encounter: Secondary | ICD-10-CM | POA: Diagnosis not present

## 2020-01-04 DIAGNOSIS — Y9384 Activity, sleeping: Secondary | ICD-10-CM | POA: Diagnosis not present

## 2020-01-04 DIAGNOSIS — S0990XA Unspecified injury of head, initial encounter: Secondary | ICD-10-CM

## 2020-01-04 HISTORY — DX: Repeated falls: R29.6

## 2020-01-04 HISTORY — DX: Unspecified dementia, unspecified severity, without behavioral disturbance, psychotic disturbance, mood disturbance, and anxiety: F03.90

## 2020-01-04 MED ORDER — BACITRACIN-NEOMYCIN-POLYMYXIN 400-5-5000 EX OINT
TOPICAL_OINTMENT | Freq: Once | CUTANEOUS | Status: AC
  Administered 2020-01-04: 1 via TOPICAL
  Filled 2020-01-04: qty 1

## 2020-01-04 NOTE — ED Triage Notes (Signed)
Pt brought in by EMS due to unwitnessed fall. Pt found laying on her back with a hematoma on her head, also fell yesterday and has a previous hematoma. Abrasion to left elbow,. No loc

## 2020-01-04 NOTE — Discharge Instructions (Addendum)
Your head CT scan is negative for any acute internal injuries.  Return here for reevaluation if you develop any symptoms as outlined below.  As discussed I recommend using your walker instead of your cane when you are walking as this will give you better stability when standing and walking.

## 2020-01-04 NOTE — ED Provider Notes (Addendum)
[facility] Provider Note   CSN: [id number] Arrival date & time: 01/04/20  1821     History Chief Complaint  Patient presents with  . Fall    Christina Ball is a 86 y.o. female with a history of dementia, diabetes, hypertension, osteoporosis and history of frequent falls presenting from her local nursing home where she was found on the floor, unwitnessed fall prior to arrival.  She has sustained a hematoma on her posterior scalp, also an abrasion at her left elbow.  She denies any pain.  She does not recall falling.  The patient's great niece at the bedside is helpful with her medical history.  She was seen here on June 2 for an additional previous fall.  She is ambulatory using a cane.  Patient is a poor history giver, denies falling stating "you have the wrong person".  The history is provided by the nursing home, medical records and a relative.       Past Medical History:  Diagnosis Date  . Angina pectoris ([place])   . Dementia ([place])   . Diabetes mellitus without complication ([place])   . Frequent falls   . Hyperlipidemia   . Hypertension   . Osteoporosis     Patient Active Problem List   Diagnosis Date Noted  . Acute renal failure superimposed on stage 3b chronic kidney disease ([place]) 08/27/2019  . Acute urinary retention 08/25/2019  . Dementia without behavioral disturbance ([place]) 08/24/2019  . Type 2 diabetes mellitus without complication ([place]) 25/42/7062  . Hyperlipidemia 08/24/2019  . Essential hypertension 08/24/2019  . Encephalopathy acute 08/24/2019    History reviewed. No pertinent surgical history.   OB History   No obstetric history on file.     No family history on file.  Social History   Tobacco Use  . Smoking status: Unknown If Ever Smoked  Substance Use Topics  . Alcohol use: Not Currently    Comment: unknown  . Drug use: Not Currently    Comment: unknown    Home Medications Prior to Admission medications   Medication Sig  Start Date End Date Taking? Authorizing Provider  Melatonin 3 MG TABS Take 3 mg by mouth at bedtime.   Yes [provider]  metFORMIN (GLUCOPHAGE) 500 MG tablet Take 500 mg by mouth every morning.   Yes [provider]  sertraline (ZOLOFT) 50 MG tablet Take 75 mg by mouth every morning.    Yes [provider]  traZODone (DESYREL) 50 MG tablet Take 25 mg by mouth at bedtime.   Yes [provider]  acetaminophen (TYLENOL) 500 MG tablet Take 500 mg by mouth every 6 (six) hours as needed for mild pain or moderate pain.    [provider]    Allergies    Patient has no known allergies.  Review of Systems   Review of Systems  Unable to perform ROS: Dementia    Physical Exam Updated Vital Signs BP (!) 186/80 (BP Location: Left Arm)   Pulse 78   Temp (!) 97.5 F (36.4 C) (Oral)   Resp 16   Ht 5\' 5"  (1.651 m)   Wt 63 kg   SpO2 99%   BMI 23.11 kg/m   Physical Exam Vitals and nursing note reviewed.  Constitutional:      General: She is not in acute distress.    Appearance: She is well-developed.  HENT:     Head: Normocephalic. No raccoon eyes or Battle's sign.     Jaw:  There is normal jaw occlusion.     Comments: Moderate sized hematoma posterior occipital scalp.  There is no scalp wound.    Right Ear: No hemotympanum.     Left Ear: No hemotympanum.     Mouth/Throat:     Comments: Edentulous Eyes:     Conjunctiva/sclera: Conjunctivae normal.  Cardiovascular:     Rate and Rhythm: Normal rate and regular rhythm.     Heart sounds: Normal heart sounds.  Pulmonary:     Effort: Pulmonary effort is normal.     Breath sounds: Normal breath sounds. No wheezing.  Abdominal:     General: Bowel sounds are normal.     Palpations: Abdomen is soft.     Tenderness: There is no abdominal tenderness.  Musculoskeletal:        General: Swelling present. No tenderness. Normal range of motion.     Left elbow: No swelling or deformity. Normal range  of motion.     Cervical back: Normal range of motion.     Left hip: No bony tenderness.     Comments: Small abrasion at the left posterior elbow.  She has full range of motion of the elbow wrist and shoulder without pain.  There is no edema or bruising noted.  Nontender abnormal hard edema at the left lateral trochanter of left hip.  There is no bruising.  Skin is intact.  Skin:    General: Skin is warm and dry.  Neurological:     Mental Status: She is alert.     ED Results / Procedures / Treatments   Labs (all labs ordered are listed, but only abnormal results are displayed) Labs Reviewed - No data to display  EKG None  Radiology DG Pelvis 1-2 Views  Result Date: 01/04/2020 CLINICAL DATA:  Left hip pain after unwitnessed fall. EXAM: PELVIS - 1-2 VIEW COMPARISON:  August 16, 2019. FINDINGS: There is no evidence of pelvic fracture or diastasis. No pelvic bone lesions are seen. IMPRESSION: Negative. Electronically Signed   By: Lupita Raider M.D.   On: 01/04/2020 20:47   CT Head Wo Contrast  Result Date: 01/04/2020 CLINICAL DATA:  Unwitnessed fall. EXAM: CT HEAD WITHOUT CONTRAST TECHNIQUE: Contiguous axial images were obtained from the base of the skull through the vertex without intravenous contrast. COMPARISON:  December 16, 2019 FINDINGS: Brain: There is moderate severity cerebral atrophy with widening of the extra-axial spaces and ventricular dilatation. There are areas of decreased attenuation within the white matter tracts of the supratentorial brain, consistent with microvascular disease changes. Vascular: No hyperdense vessel or unexpected calcification. Skull: Normal. Negative for fracture or focal lesion. Sinuses/Orbits: No acute finding. Other: There is moderate severity focal right periorbital soft tissue swelling. An associated 2.1 cm x 1.1 cm hematoma is seen. This is present on the prior study. Moderate severity left occipital scalp soft tissue swelling is also noted. IMPRESSION:  1. Moderate severity right periorbital soft tissue swelling with an associated 2.1 cm x 1.1 cm hematoma. 2. Moderate severity left occipital scalp soft tissue swelling. 3. No acute intracranial abnormality. Electronically Signed   By: Aram Candela M.D.   On: 01/04/2020 20:42    Procedures Procedures (including critical care time)  Medications Ordered in ED Medications  neomycin-bacitracin-polymyxin (NEOSPORIN) ointment packet (1 application Topical Given 01/04/20 2049)    ED Course  I have reviewed the triage vital signs and the nursing notes.  Pertinent labs & imaging results that were available during my care of the patient  were reviewed by me and considered in my medical decision making (see chart for details).    MDM Rules/Calculators/A&P                          CT imaging reviewed, patient has no subdural or other internal head injury.  She does have a mild new occipital hematoma, old right periorbital injury with mild fading bruising noted at the site.  Return instructions were provided.  Patient is cleared to return to the nursing facility.  She was ambulated in the department and had no distress, weight bearing on extremities.  Advised using a walker in place of cane for better support.  (great niece at bedside confirms she has both).  Final Clinical Impression(s) / ED Diagnoses Final diagnoses:  Fall, initial encounter  Injury of head, initial encounter    Rx / DC Orders ED Discharge Orders    None       Victoriano Lain 01/04/20 2119    Burgess Amor, PA-C 01/04/20 2154    Derwood Kaplan, MD 01/04/20 2221

## 2020-01-04 NOTE — ED Notes (Signed)
Pt wheeled to waiting room with family. Pts daughter verbalized understanding of discharge instructions.

## 2020-06-15 DEATH — deceased

## 2020-12-28 IMAGING — CT CT CERVICAL SPINE W/O CM
3 series · 14 of 27 positions shown, 17 images · non-contrast
Comparison: 08/24/2019

CLINICAL DATA: Found on floor with facial hematoma. Initial
encounter.

EXAM:
CT HEAD WITHOUT CONTRAST
CT MAXILLOFACIAL WITHOUT CONTRAST
CT CERVICAL SPINE WITHOUT CONTRAST
TECHNIQUE: Multidetector CT imaging of the head, cervical spine, and
maxillofacial structures were performed using the standard protocol
without intravenous contrast. Multiplanar CT image reconstructions
of the cervical spine and maxillofacial structures were also
generated.

[Series 4: c spine soft · axial · 0.37mm/px · z∈[-53,+7]mm · 4 of 61 slices shown]
[im 11/61  soft-tissue]
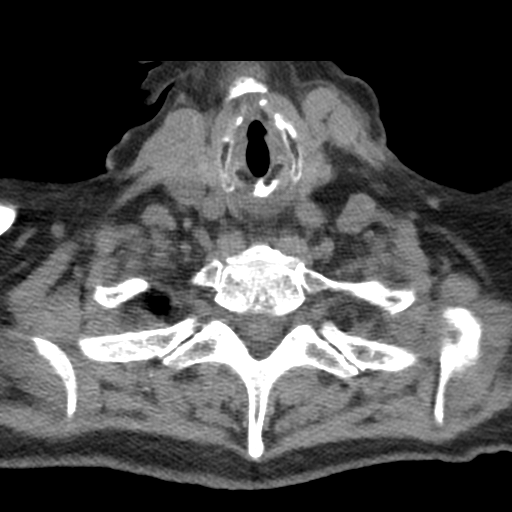
[im 21/61  soft-tissue]
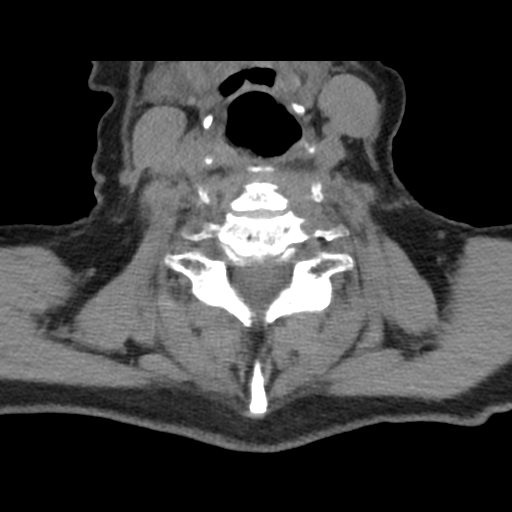
[im 31/61  soft-tissue]
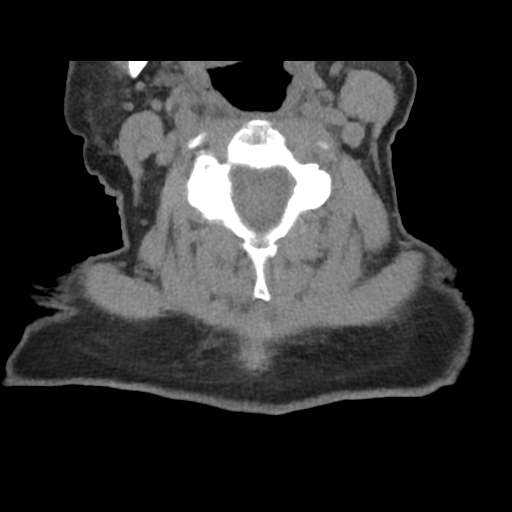
[im 41/61  soft-tissue]
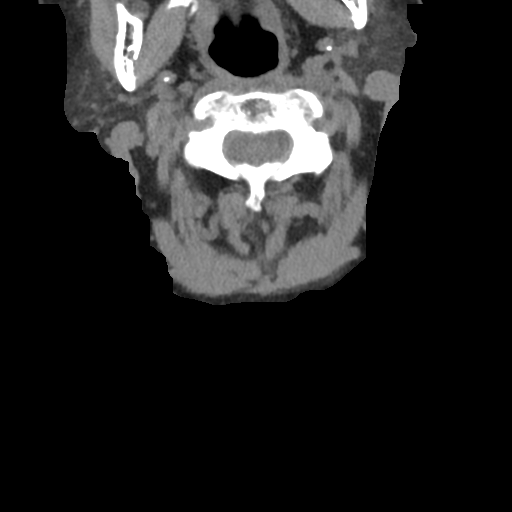

[Series 5: sag bone · sagittal · 0.23mm/px · 5 of 61 slices shown, 6 images]
[im 21/61  bone]
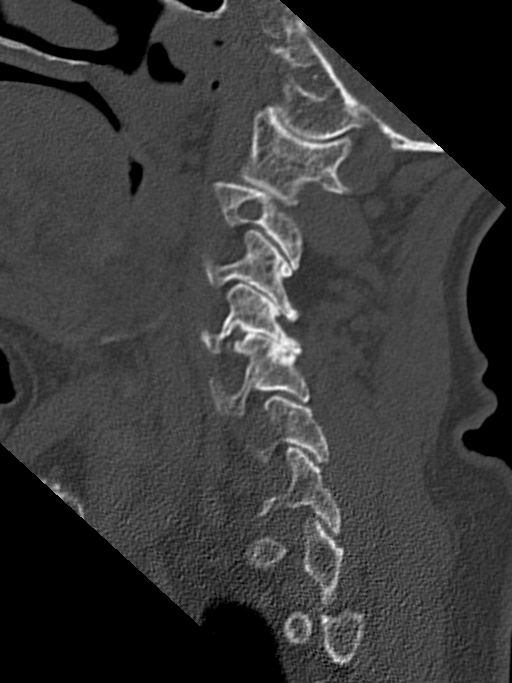
[im 26/61  bone]
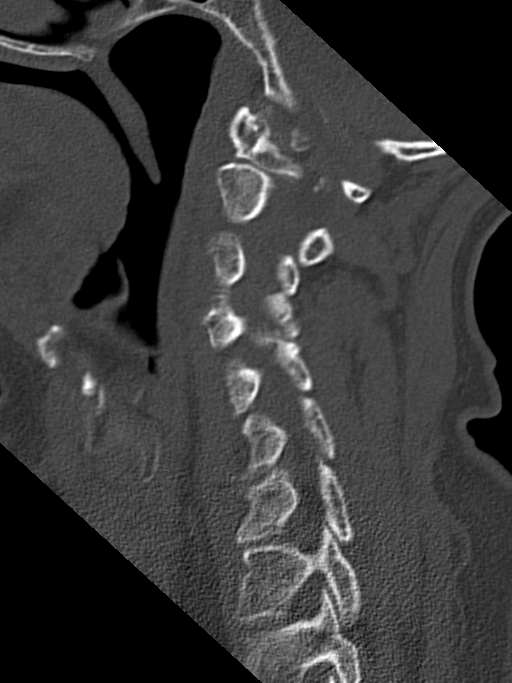
[im 31/61  soft-tissue]
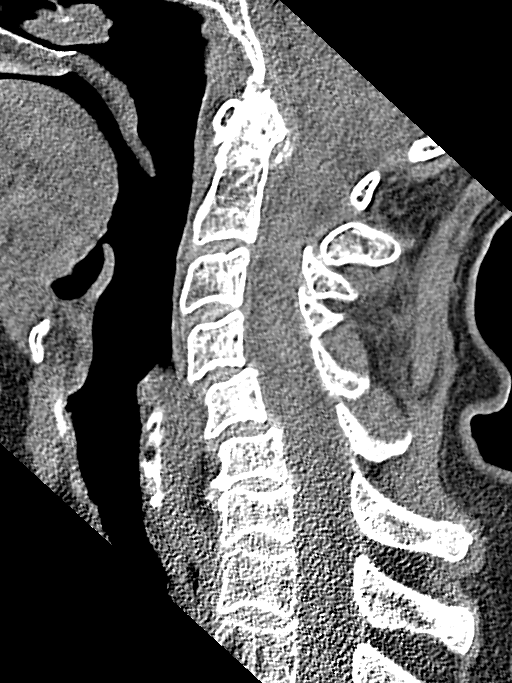
[im 31/61  bone]
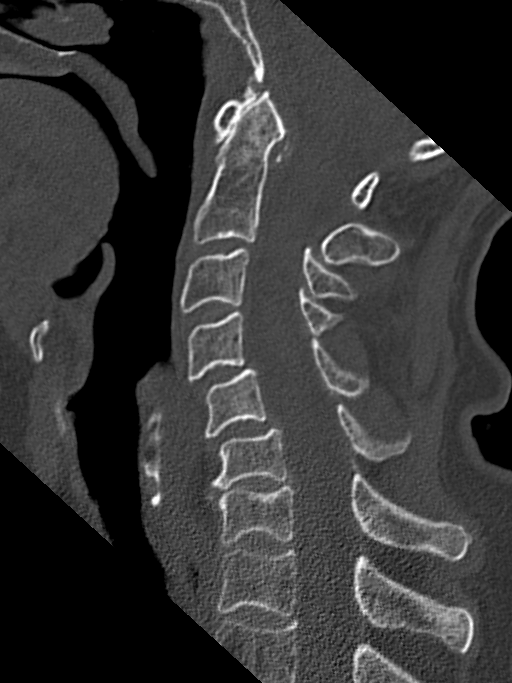
[im 36/61  bone]
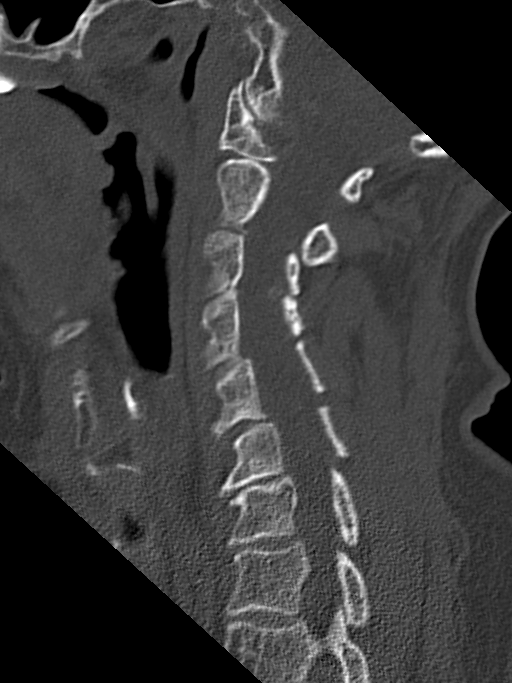
[im 41/61  bone]
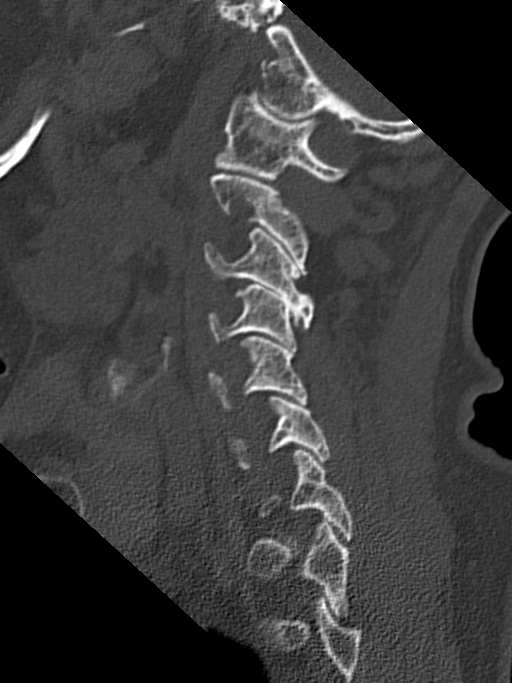

[Series 7: orthogonal axials · axial · 0.21mm/px · z∈[-51,+8]mm · 5 of 54 slices shown, 7 images]
[im 9/54  soft-tissue]
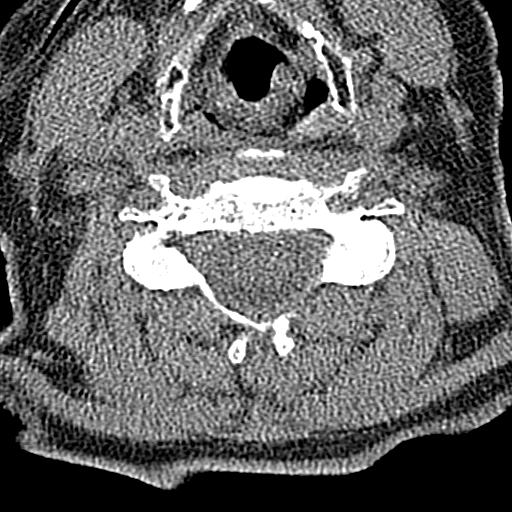
[im 9/54  bone]
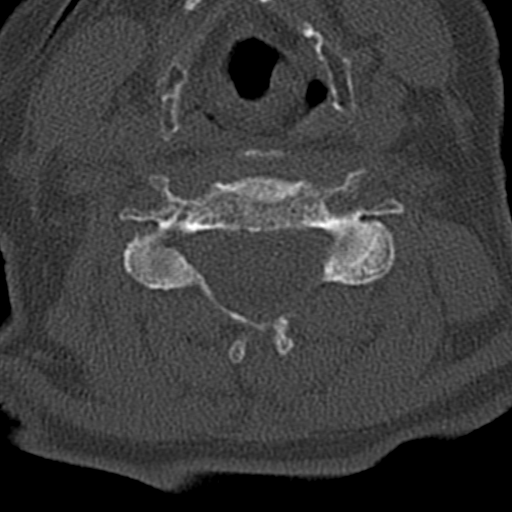
[im 18/54  bone]
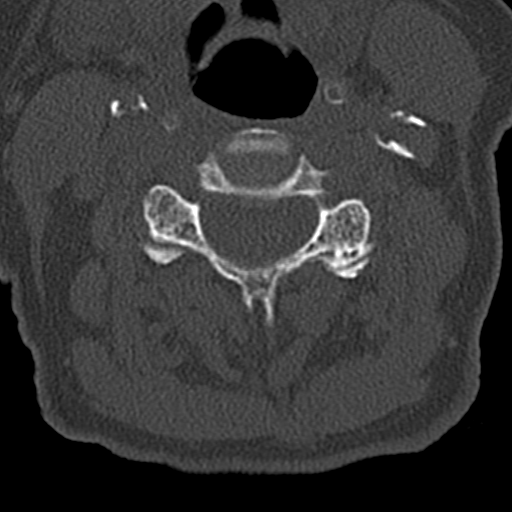
[im 27/54  bone]
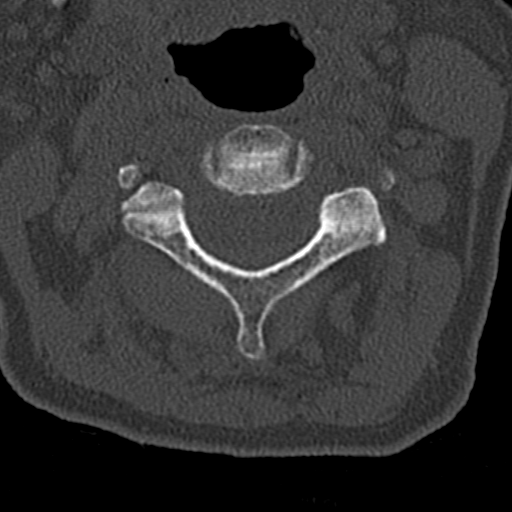
[im 36/54  bone]
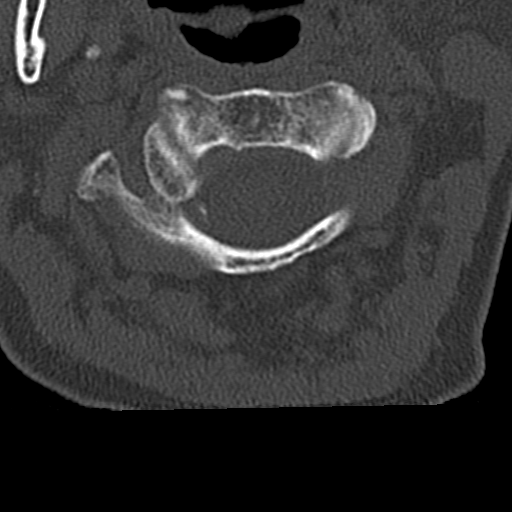
[im 45/54  soft-tissue]
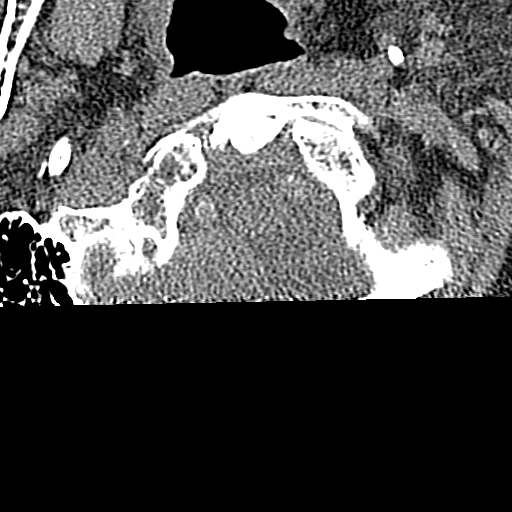
[im 45/54  bone]
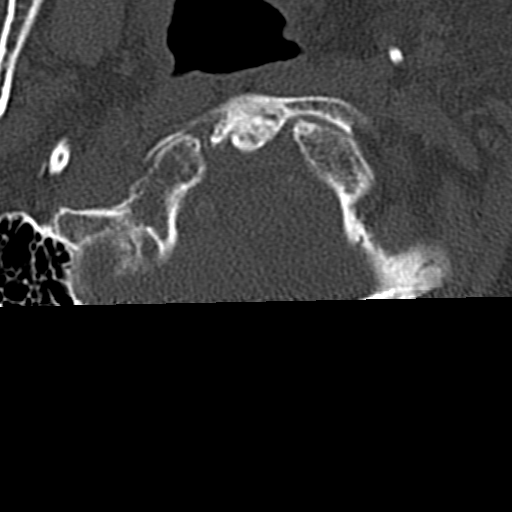

[14 of 27 positions shown; findings below may reference images not displayed]

FINDINGS: CT HEAD FINDINGS

Brain: No evidence of acute infarction, hemorrhage, hydrocephalus,
extra-axial collection or mass lesion/mass effect. Brain atrophy
that is advanced in the temporal lobes, correlating with history of
dementia. Chronic small vessel ischemia.

Vascular: No hyperdense vessel or unexpected calcification.

Skull: Facial findings described below.  No calvarial fracture.

CT MAXILLOFACIAL FINDINGS

Osseous: Healed left maxillary sinus and orbital floor fractures
which were seen on prior. No acute fracture or mandibular
dislocation.

Orbits: No evidence of postseptal injury.

Sinuses: No active sinusitis or hemosinus.

Soft tissues: Large hematoma about the right eye. No opaque foreign
body

CT CERVICAL SPINE FINDINGS

Alignment: Degenerative anterolisthesis at C4-5 and C5-6. No
traumatic malalignment

Skull base and vertebrae: No acute fracture. Incidental T2 body
hemangioma.

Soft tissues and spinal canal: No prevertebral fluid or swelling. No
visible canal hematoma.

Disc levels:  Degenerative facet spurring and C6-7 disc narrowing.

Upper chest: No acute finding
IMPRESSION: 1. No evidence of acute intracranial or cervical spine injury.
2. Right facial hematoma without acute fracture.

## 2020-12-28 IMAGING — CT CT HEAD W/O CM
3 series · 14 of 46 positions shown, 16 images · non-contrast
Comparison: 08/24/2019

CLINICAL DATA: Found on floor with facial hematoma. Initial
encounter.

EXAM:
CT HEAD WITHOUT CONTRAST
CT MAXILLOFACIAL WITHOUT CONTRAST
CT CERVICAL SPINE WITHOUT CONTRAST
TECHNIQUE: Multidetector CT imaging of the head, cervical spine, and
maxillofacial structures were performed using the standard protocol
without intravenous contrast. Multiplanar CT image reconstructions
of the cervical spine and maxillofacial structures were also
generated.

[Series 2: head w o · axial · 0.37mm/px · z∈[+31,+151]mm · 8 of 29 slices shown, 10 images]
[im 3/29  brain]
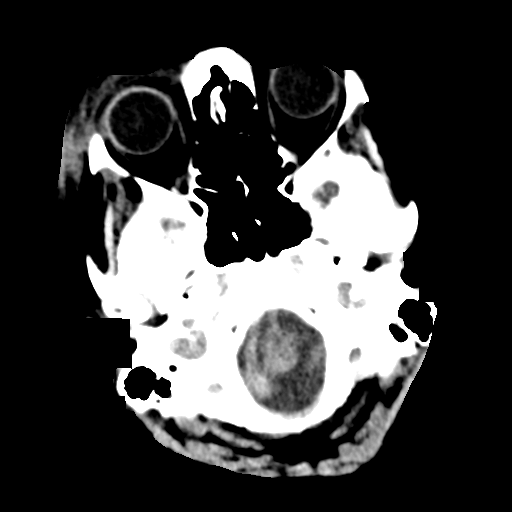
[im 3/29  bone]
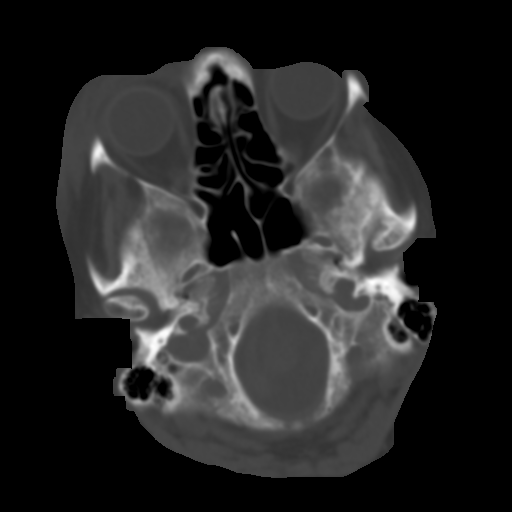
[im 7/29  brain]
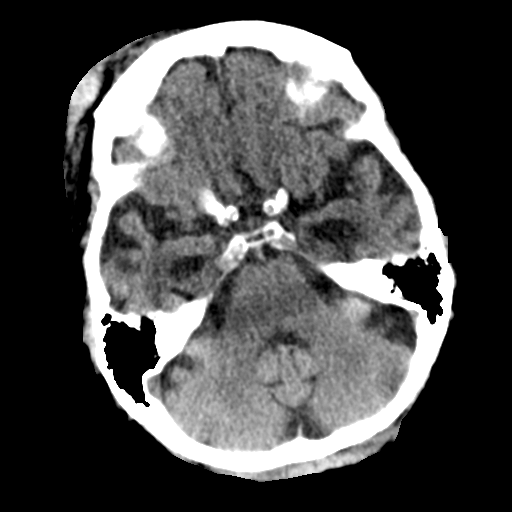
[im 10/29  brain]
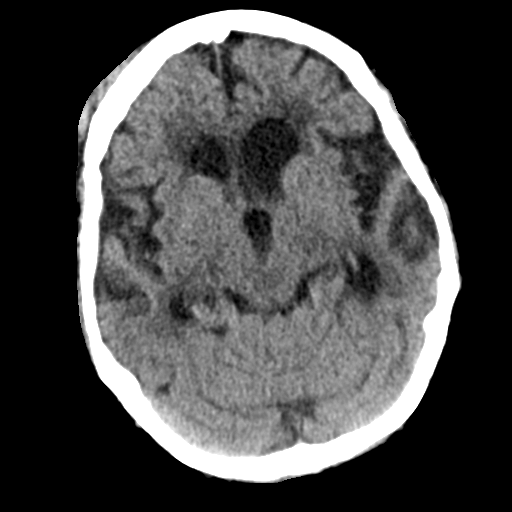
[im 13/29  brain]
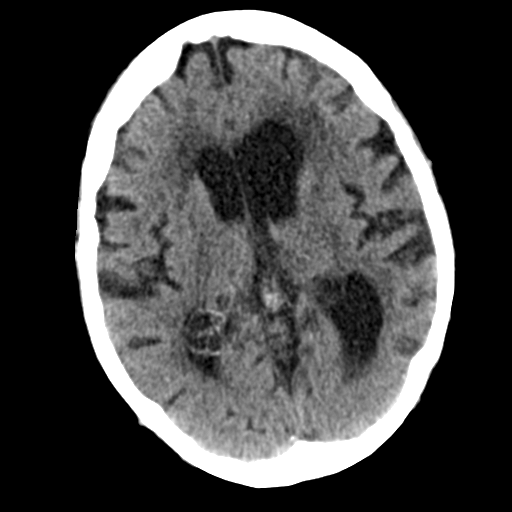
[im 17/29  brain]
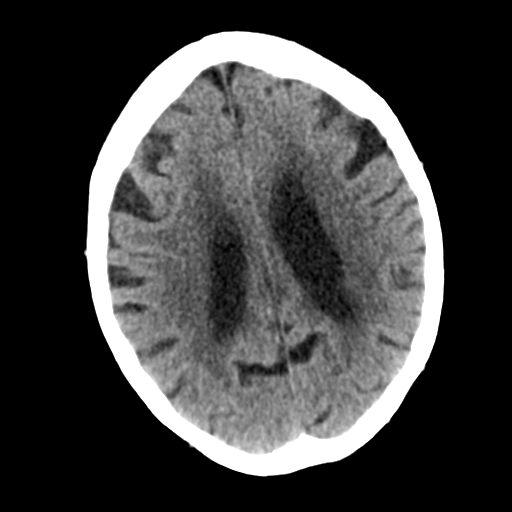
[im 17/29  bone]
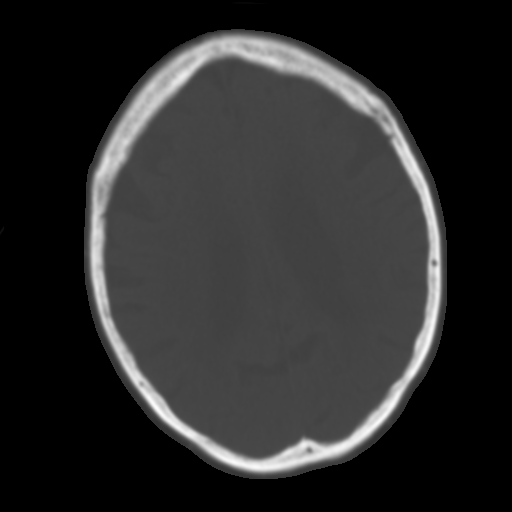
[im 20/29  brain]
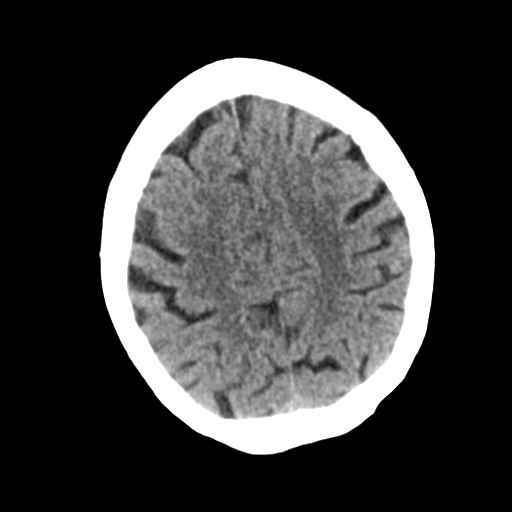
[im 23/29  brain]
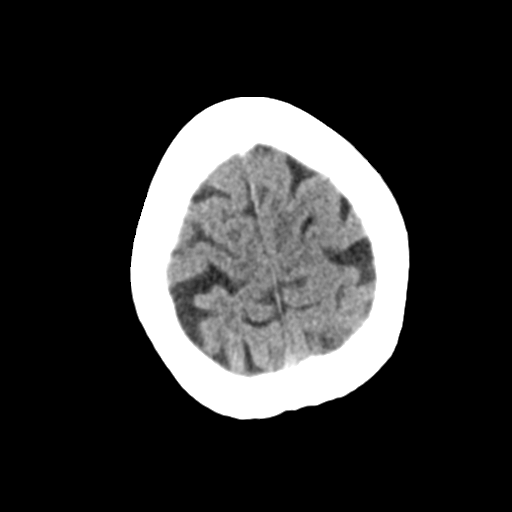
[im 27/29  brain]
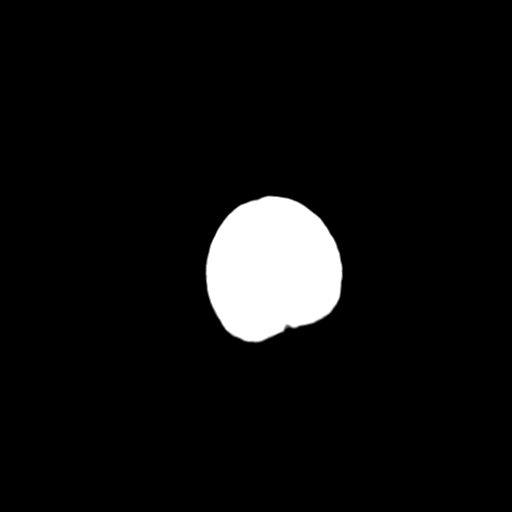

[Series 4: coronal soft · coronal · 0.28mm/px · 3 of 64 slices shown]
[im 22/64  brain]
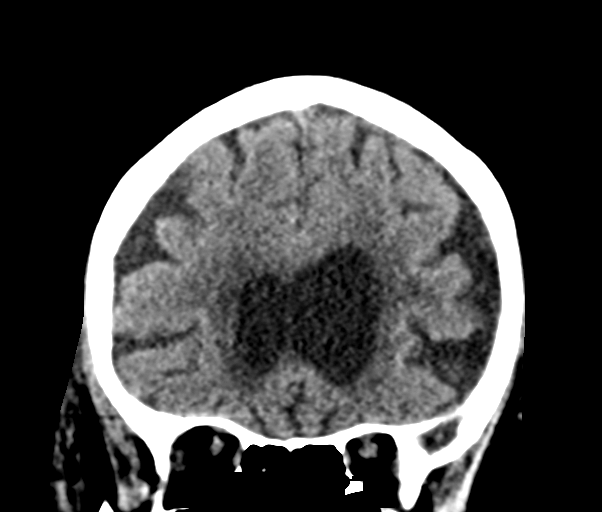
[im 29/64  brain]
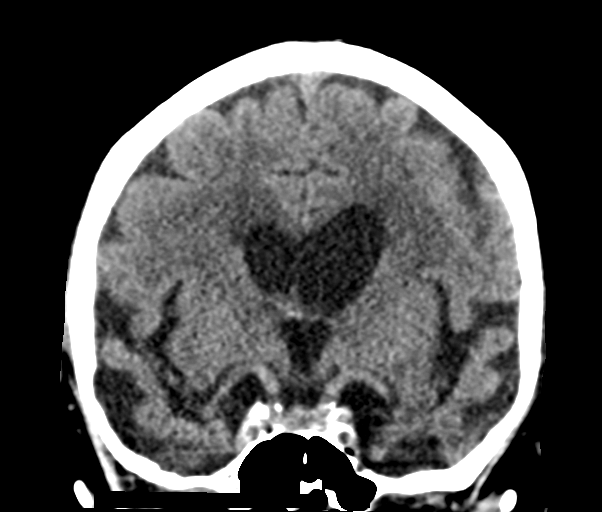
[im 36/64  brain]
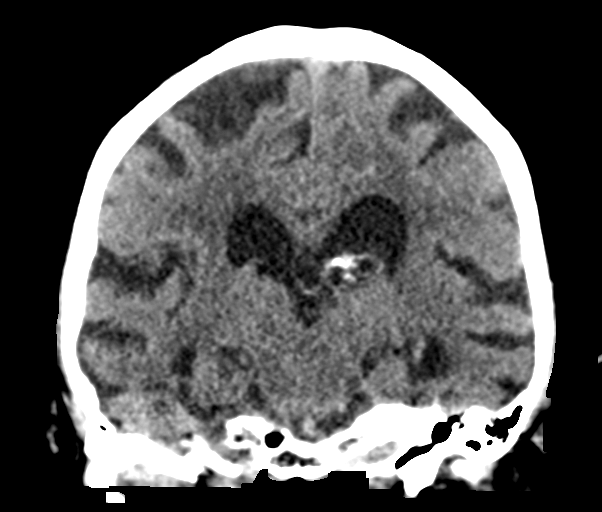

[Series 5: sagittal soft · sagittal · 0.28mm/px · 3 of 52 slices shown]
[im 18/52  brain]
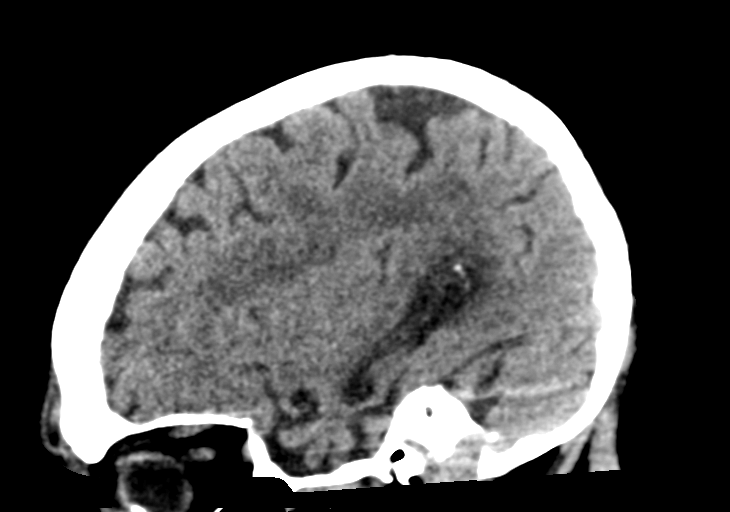
[im 26/52  brain]
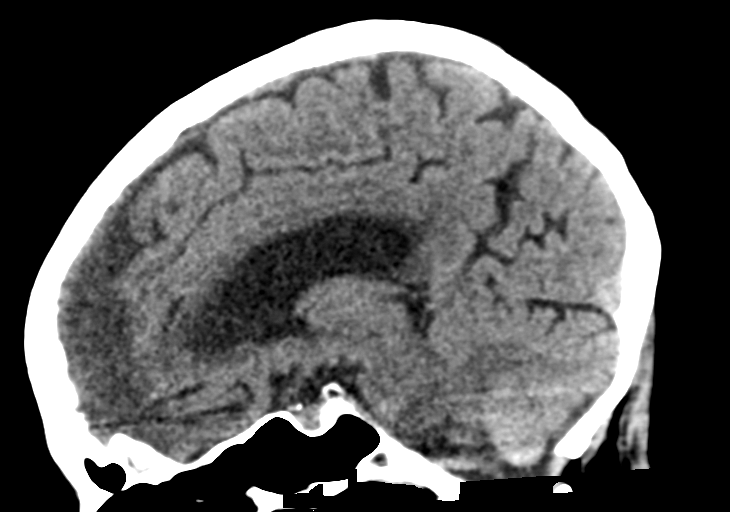
[im 35/52  brain]
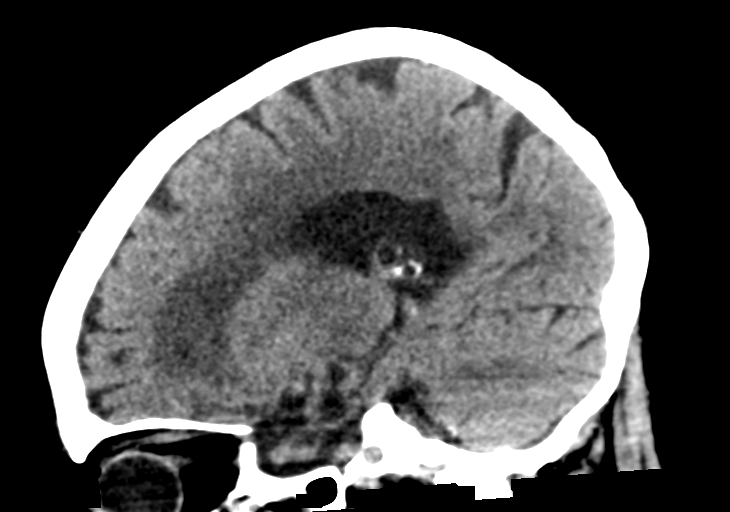

[14 of 46 positions shown; findings below may reference images not displayed]

FINDINGS: CT HEAD FINDINGS

Brain: No evidence of acute infarction, hemorrhage, hydrocephalus,
extra-axial collection or mass lesion/mass effect. Brain atrophy
that is advanced in the temporal lobes, correlating with history of
dementia. Chronic small vessel ischemia.

Vascular: No hyperdense vessel or unexpected calcification.

Skull: Facial findings described below.  No calvarial fracture.

CT MAXILLOFACIAL FINDINGS

Osseous: Healed left maxillary sinus and orbital floor fractures
which were seen on prior. No acute fracture or mandibular
dislocation.

Orbits: No evidence of postseptal injury.

Sinuses: No active sinusitis or hemosinus.

Soft tissues: Large hematoma about the right eye. No opaque foreign
body

CT CERVICAL SPINE FINDINGS

Alignment: Degenerative anterolisthesis at C4-5 and C5-6. No
traumatic malalignment

Skull base and vertebrae: No acute fracture. Incidental T2 body
hemangioma.

Soft tissues and spinal canal: No prevertebral fluid or swelling. No
visible canal hematoma.

Disc levels:  Degenerative facet spurring and C6-7 disc narrowing.

Upper chest: No acute finding
IMPRESSION: 1. No evidence of acute intracranial or cervical spine injury.
2. Right facial hematoma without acute fracture.

## 2020-12-28 IMAGING — CT CT MAXILLOFACIAL W/O CM
3 series · 15 of 47 positions shown, 18 images · non-contrast
Comparison: 08/24/2019

CLINICAL DATA: Found on floor with facial hematoma. Initial
encounter.

EXAM:
CT HEAD WITHOUT CONTRAST
CT MAXILLOFACIAL WITHOUT CONTRAST
CT CERVICAL SPINE WITHOUT CONTRAST
TECHNIQUE: Multidetector CT imaging of the head, cervical spine, and
maxillofacial structures were performed using the standard protocol
without intravenous contrast. Multiplanar CT image reconstructions
of the cervical spine and maxillofacial structures were also
generated.

[Series 2: max soft · axial · 0.33mm/px · z∈[-58,+64]mm · 9 of 71 slices shown, 12 images]
[im 5/71  brain]
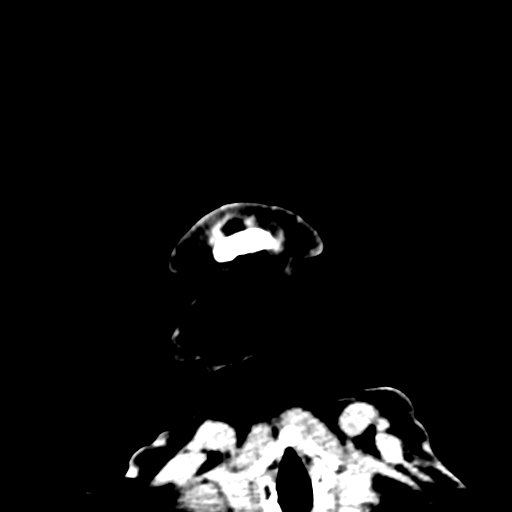
[im 5/71  bone]
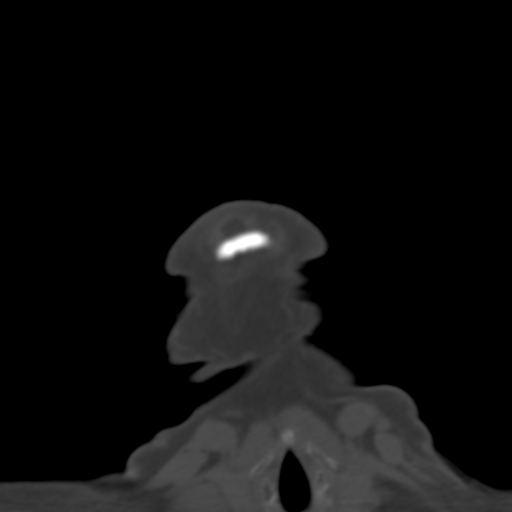
[im 13/71  bone]
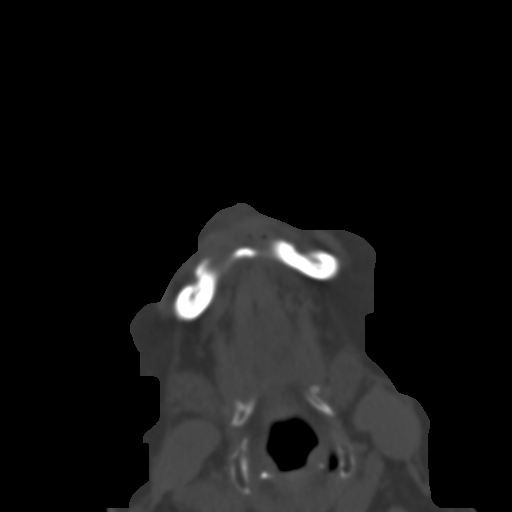
[im 20/71  bone]
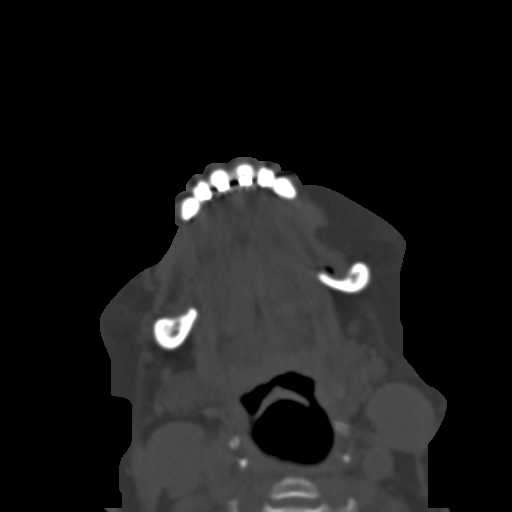
[im 27/71  bone]
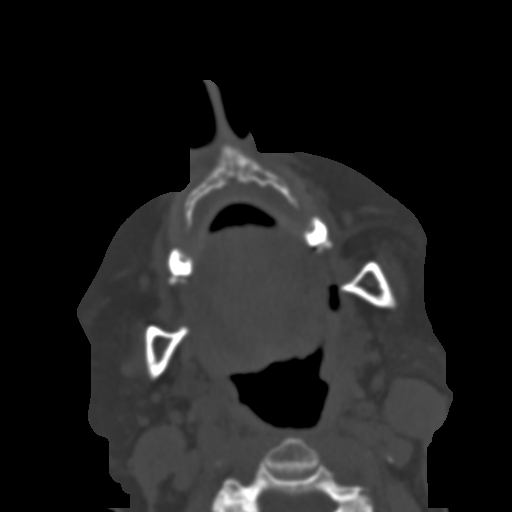
[im 37/71  brain]
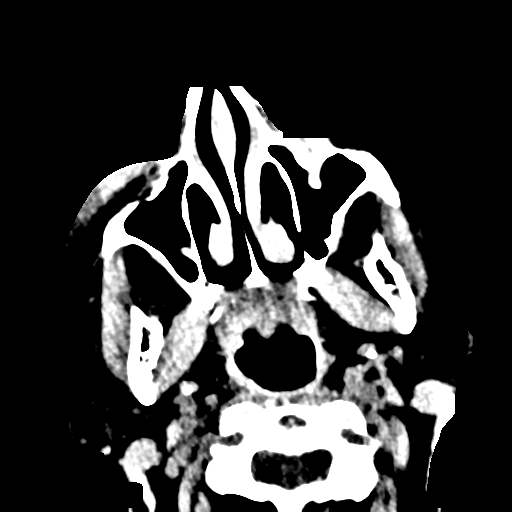
[im 37/71  bone]
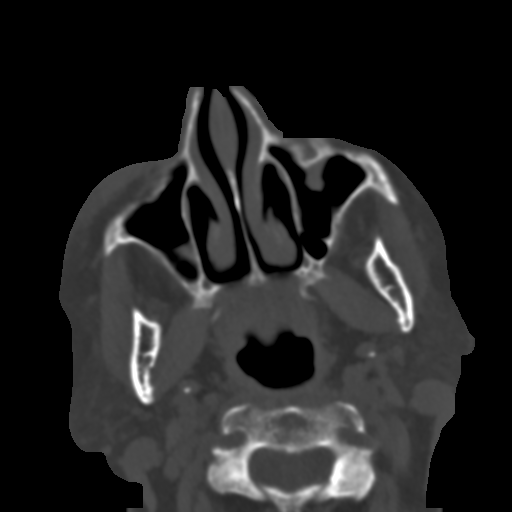
[im 44/71  bone]
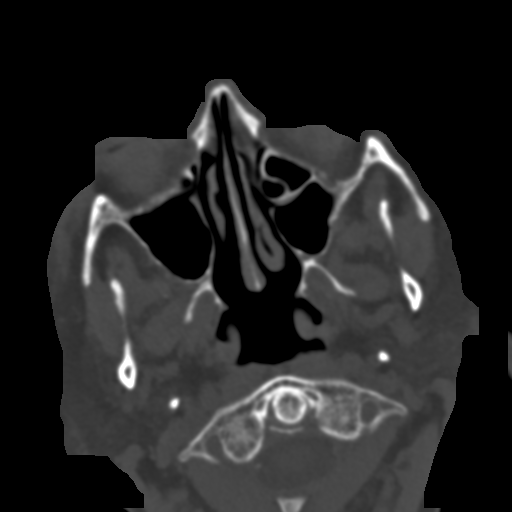
[im 51/71  bone]
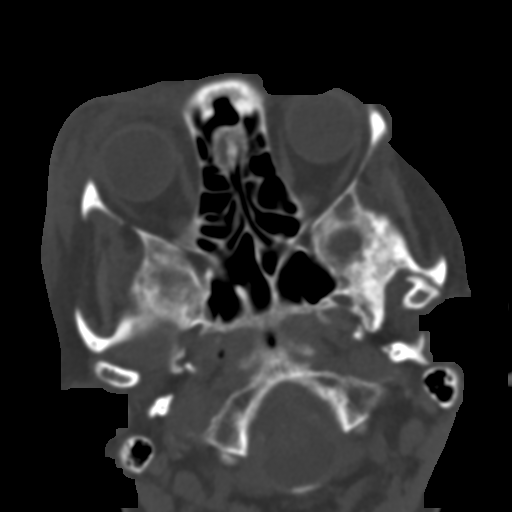
[im 58/71  bone]
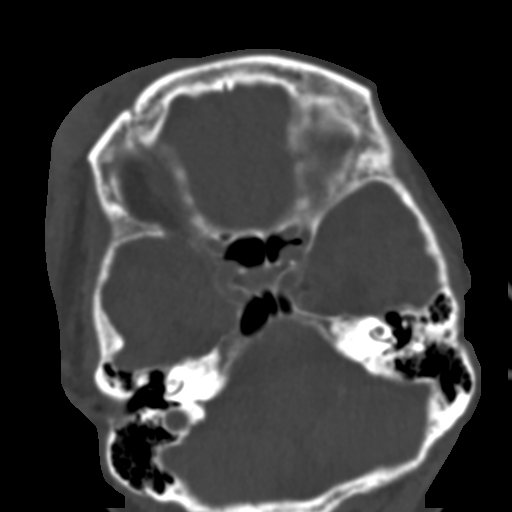
[im 66/71  brain]
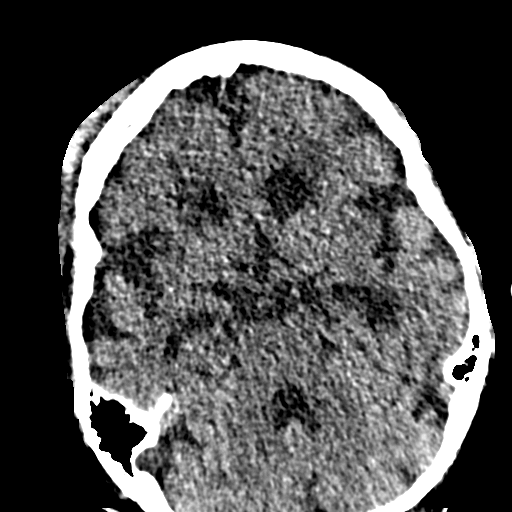
[im 66/71  bone]
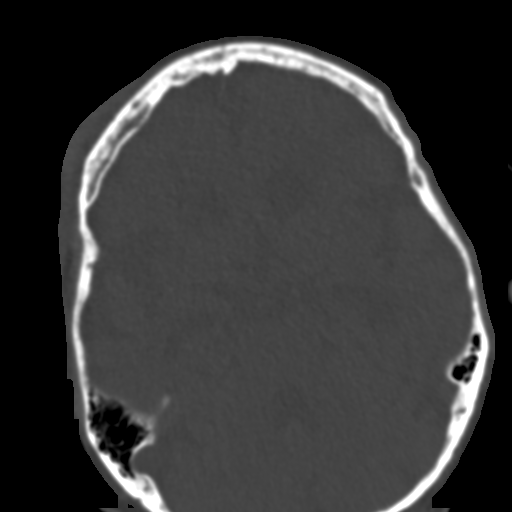

[Series 6: coronal soft · coronal · 0.30mm/px · 3 of 75 slices shown]
[im 25/75  bone]
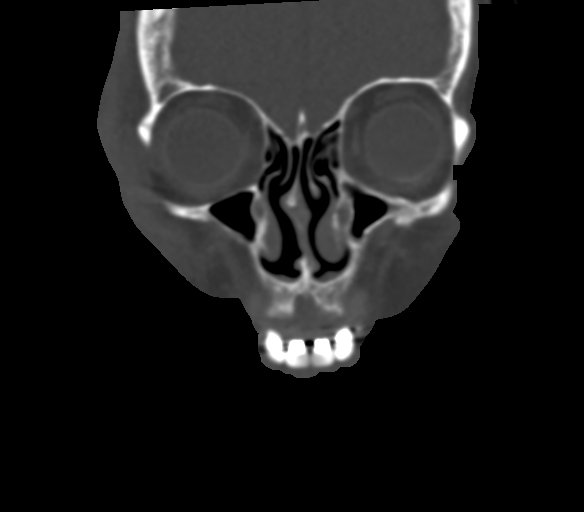
[im 33/75  bone]
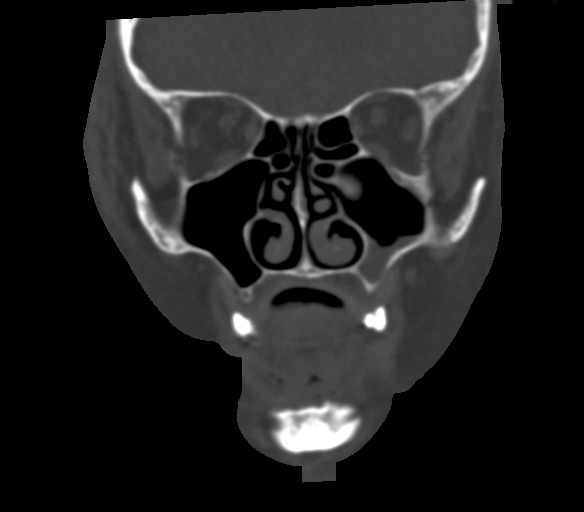
[im 42/75  bone]
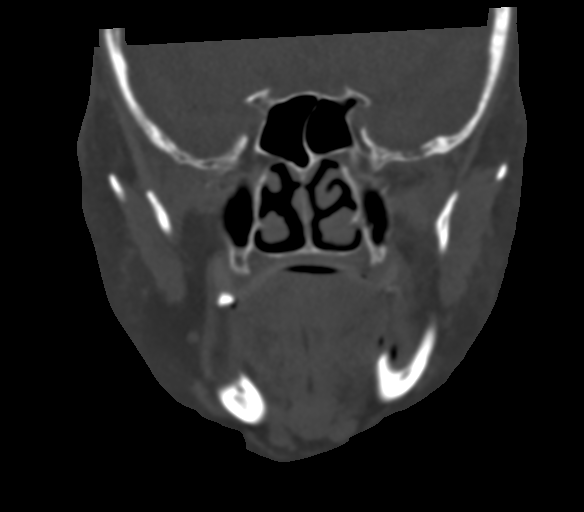

[Series 7: sagittal soft · sagittal · 0.33mm/px · 3 of 76 slices shown]
[im 26/76  bone]
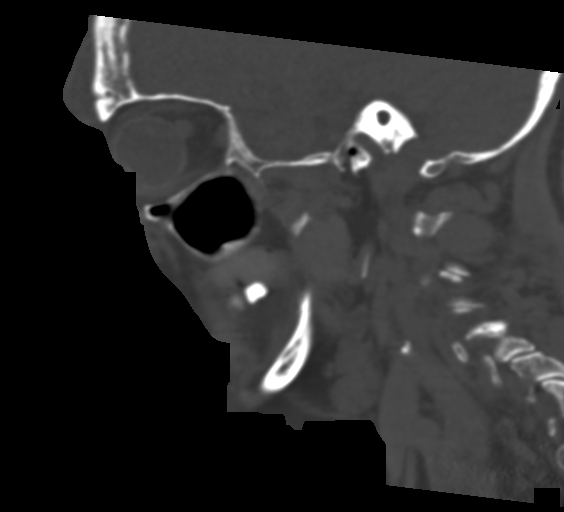
[im 38/76  bone]
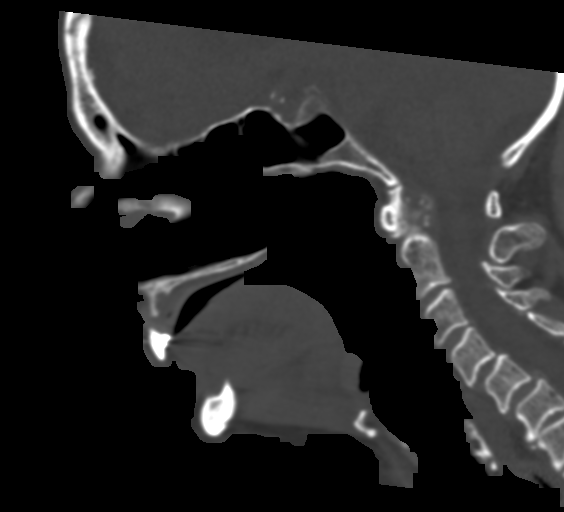
[im 51/76  bone]
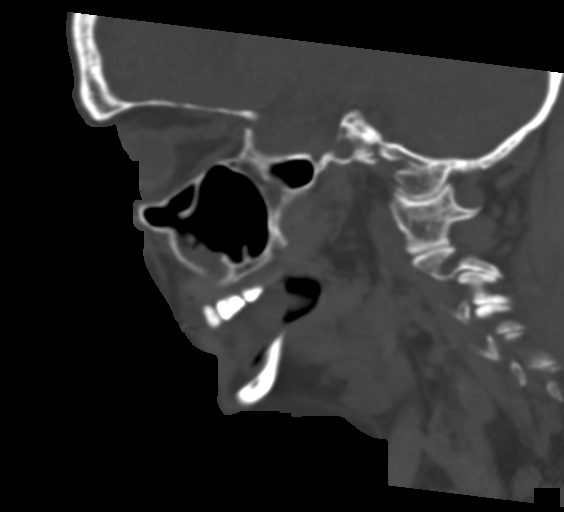

[15 of 47 positions shown; findings below may reference images not displayed]

FINDINGS: CT HEAD FINDINGS

Brain: No evidence of acute infarction, hemorrhage, hydrocephalus,
extra-axial collection or mass lesion/mass effect. Brain atrophy
that is advanced in the temporal lobes, correlating with history of
dementia. Chronic small vessel ischemia.

Vascular: No hyperdense vessel or unexpected calcification.

Skull: Facial findings described below.  No calvarial fracture.

CT MAXILLOFACIAL FINDINGS

Osseous: Healed left maxillary sinus and orbital floor fractures
which were seen on prior. No acute fracture or mandibular
dislocation.

Orbits: No evidence of postseptal injury.

Sinuses: No active sinusitis or hemosinus.

Soft tissues: Large hematoma about the right eye. No opaque foreign
body

CT CERVICAL SPINE FINDINGS

Alignment: Degenerative anterolisthesis at C4-5 and C5-6. No
traumatic malalignment

Skull base and vertebrae: No acute fracture. Incidental T2 body
hemangioma.

Soft tissues and spinal canal: No prevertebral fluid or swelling. No
visible canal hematoma.

Disc levels:  Degenerative facet spurring and C6-7 disc narrowing.

Upper chest: No acute finding
IMPRESSION: 1. No evidence of acute intracranial or cervical spine injury.
2. Right facial hematoma without acute fracture.

## 2021-01-16 IMAGING — DX DG PELVIS 1-2V
1 series · 1 of 1 positions shown · non-contrast
Comparison: August 16, 2019.

CLINICAL DATA: Left hip pain after unwitnessed fall.

EXAM:
PELVIS - 1-2 VIEW

[pelvis ap]
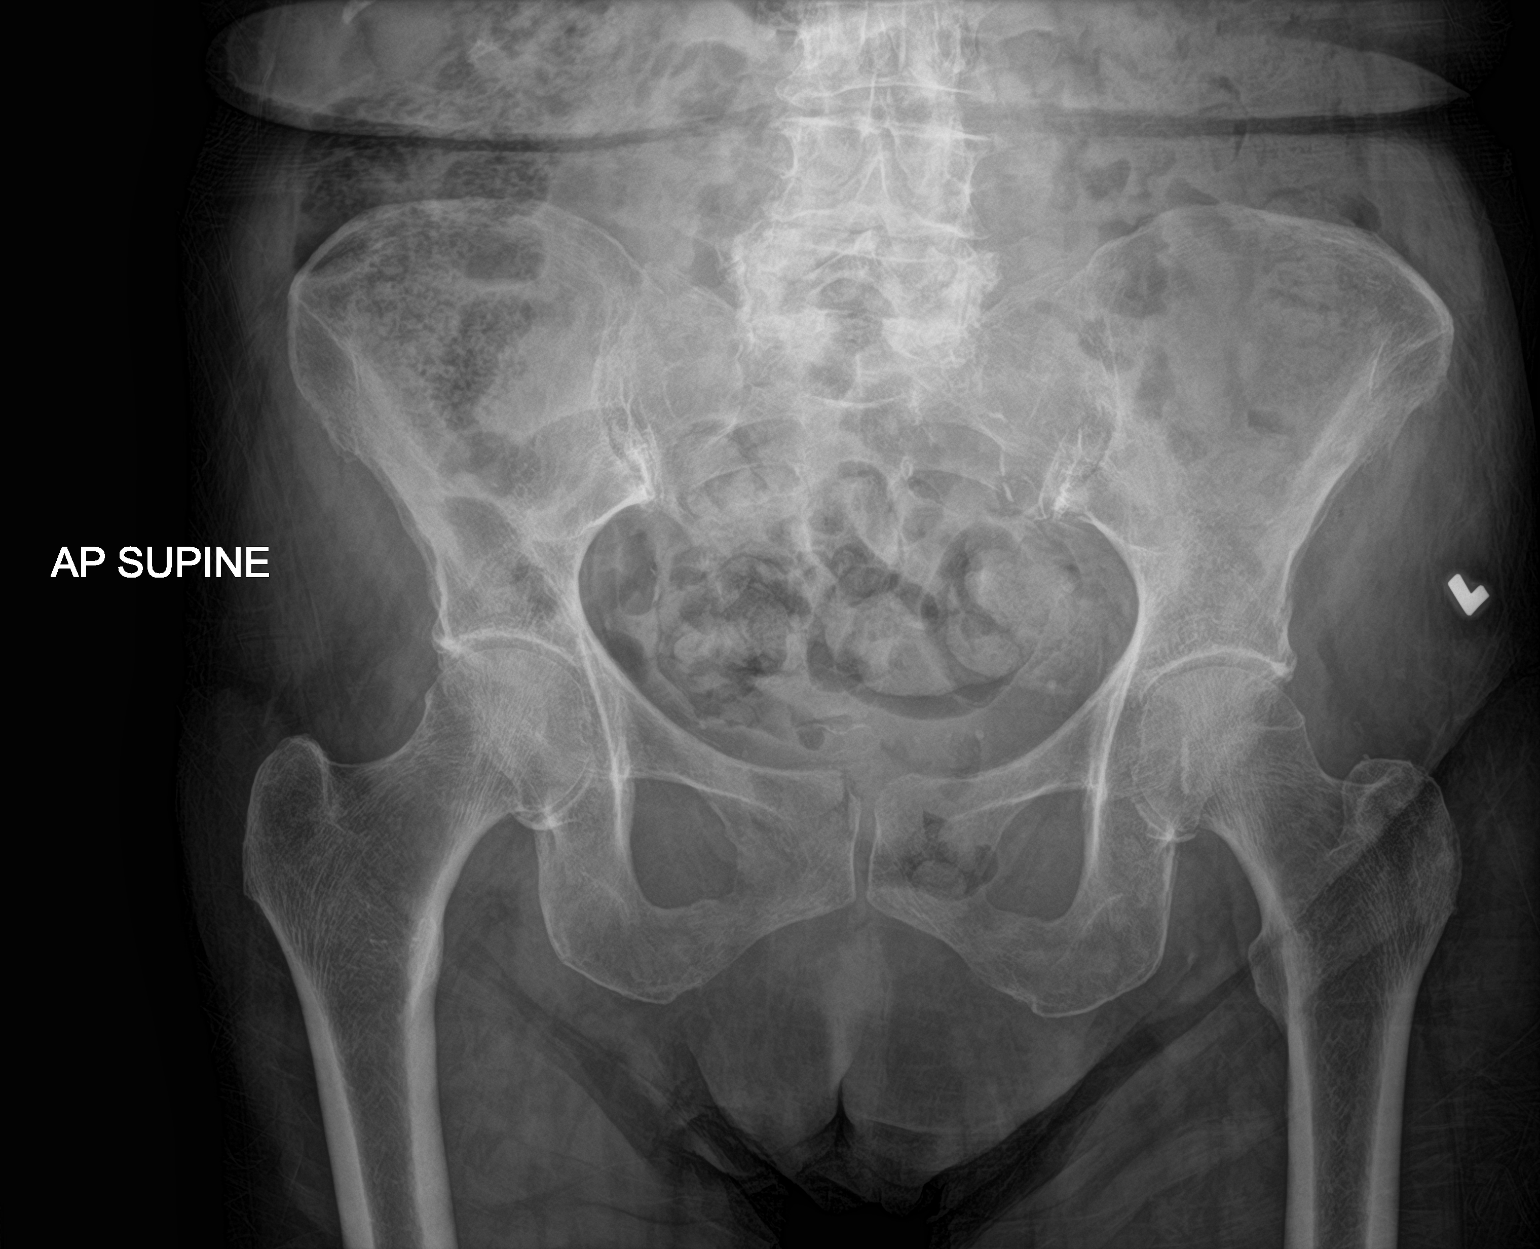

[1 of 1 positions shown; findings below may reference images not displayed]

FINDINGS: There is no evidence of pelvic fracture or diastasis. No pelvic bone
lesions are seen.
IMPRESSION: Negative.
